# Patient Record
Sex: Male | Born: 1950 | Race: Black or African American | Hispanic: No | Smoking: Never smoker
Health system: Southern US, Community
[De-identification: ages and names within clinical notes are randomized; demographics above are authoritative.]

## PROBLEM LIST (undated history)

## (undated) DIAGNOSIS — I10 Essential (primary) hypertension: Secondary | ICD-10-CM

## (undated) DIAGNOSIS — D649 Anemia, unspecified: Secondary | ICD-10-CM

## (undated) HISTORY — PX: EXTERNAL EAR SURGERY: SHX627

## (undated) HISTORY — DX: Essential (primary) hypertension: I10

## (undated) HISTORY — PX: FOOT SURGERY: SHX648

## (undated) HISTORY — DX: Anemia, unspecified: D64.9

---

## 2005-05-25 ENCOUNTER — Emergency Department (HOSPITAL_COMMUNITY): Admission: EM | Admit: 2005-05-25 | Discharge: 2005-05-25 | Payer: Self-pay | Admitting: Emergency Medicine

## 2007-11-16 ENCOUNTER — Emergency Department (HOSPITAL_COMMUNITY): Admission: EM | Admit: 2007-11-16 | Discharge: 2007-11-16 | Payer: Self-pay | Admitting: Emergency Medicine

## 2010-10-07 LAB — URINE CULTURE: Colony Count: >=100000

## 2010-10-07 LAB — DIFFERENTIAL
Basophils Absolute: 0
Eosinophils Absolute: 0.1
Eosinophils Relative: 1
Lymphs Abs: 1.7
Monocytes Relative: 7
Neutrophils Relative %: 79 — ABNORMAL HIGH

## 2010-10-07 LAB — COMPREHENSIVE METABOLIC PANEL
ALT: 12
AST: 16
BUN: 15
CO2: 27
Creatinine, Ser: 1.12
Glucose, Bld: 110 — ABNORMAL HIGH
Total Protein: 8.1

## 2010-10-07 LAB — URINALYSIS, ROUTINE W REFLEX MICROSCOPIC
Glucose, UA: NEGATIVE
Nitrite: NEGATIVE
Specific Gravity, Urine: >1.03 — ABNORMAL HIGH

## 2010-10-07 LAB — CBC
MCHC: 33.6
MCV: 102.7 — ABNORMAL HIGH
RDW: 13.5

## 2010-10-07 LAB — URINE MICROSCOPIC-ADD ON

## 2013-09-20 ENCOUNTER — Encounter: Payer: Self-pay | Admitting: Internal Medicine

## 2013-10-20 ENCOUNTER — Ambulatory Visit: Payer: Self-pay | Admitting: Gastroenterology

## 2013-11-24 ENCOUNTER — Ambulatory Visit: Payer: Self-pay | Admitting: Gastroenterology

## 2013-11-24 ENCOUNTER — Encounter: Payer: Self-pay | Admitting: Gastroenterology

## 2013-11-24 ENCOUNTER — Telehealth: Payer: Self-pay | Admitting: Internal Medicine

## 2013-11-24 NOTE — Telephone Encounter (Signed)
PATIENT WAS A NO SHOW ON 11/24/13  LETTER SENT

## 2014-01-17 ENCOUNTER — Ambulatory Visit (INDEPENDENT_AMBULATORY_CARE_PROVIDER_SITE_OTHER): Payer: Self-pay | Admitting: Gastroenterology

## 2014-01-17 ENCOUNTER — Encounter: Payer: Self-pay | Admitting: Gastroenterology

## 2014-01-17 ENCOUNTER — Telehealth: Payer: Self-pay

## 2014-01-17 ENCOUNTER — Telehealth: Payer: Self-pay | Admitting: Gastroenterology

## 2014-01-17 ENCOUNTER — Other Ambulatory Visit: Payer: Self-pay

## 2014-01-17 VITALS — BP 142/90 | HR 97 | Temp 98.0°F | Ht 74.0 in | Wt 142.6 lb

## 2014-01-17 DIAGNOSIS — R195 Other fecal abnormalities: Secondary | ICD-10-CM

## 2014-01-17 DIAGNOSIS — D62 Acute posthemorrhagic anemia: Secondary | ICD-10-CM

## 2014-01-17 DIAGNOSIS — D649 Anemia, unspecified: Secondary | ICD-10-CM

## 2014-01-17 MED ORDER — PEG-KCL-NACL-NASULF-NA ASC-C 100 G PO SOLR: 1.0000 | Freq: Once | ORAL | Status: AC

## 2014-01-17 NOTE — Telephone Encounter (Signed)
We need to get a ferritin and BMP if possible. This was not done at the [facility] clinic. Thanks!

## 2014-01-17 NOTE — Assessment & Plan Note (Signed)
64 year old male with anemia of unknown etiology and heme positive stool but without overt GI bleeding. Hgb in 10 range with slightly elevated iron at 196. Unknown ferritin. No concerning lower or upper GI symptoms noted. Will need to check ferritin. Per patient, no known renal disease. Check BMP as well. Proceed with colonoscopy +/- EGD in the near future.   Proceed with TCS+/- EGD with Dr. Jena Gaussourk in near future: the risks, benefits, and alternatives have been discussed with the patient in detail. The patient states understanding and desires to proceed. Phenergan 12.5 mg IV on call due to ETOH use

## 2014-01-17 NOTE — Patient Instructions (Signed)
We have scheduled you for a colonoscopy and possible upper endoscopy with Dr. [name].  Further recommendations to follow!   

## 2014-01-17 NOTE — Telephone Encounter (Signed)
Called pt and he states he will be coming by tomorrow with papers. He is unsure if he will have a ride to procedure but he will let us know by Friday 01/19/2014.

## 2014-01-17 NOTE — Telephone Encounter (Signed)
Please explain to the patient that we would like to have his paperwork completed prior to his tcs.  If he receives any benefits such as food stamps, we would like to have a copy of his eligibility letter, please.   Routing to [place] for follow-up.

## 2014-01-17 NOTE — Assessment & Plan Note (Signed)
Without overt GI bleeding. Check iron now. Check BMP for renal status.

## 2014-01-17 NOTE — Progress Notes (Signed)
Primary Care Physician:  Alda Lea Primary Gastroenterologist:  Dr. Jena Gauss   Chief Complaint  Patient presents with  . Anemia    HPI:   Lawrence Watkins is a 64 y.o. male presenting today at [facility] request of [facility], [facility] [place], [place], secondary to anemia and heme positive stool. Outside labs reveal Hgb 10.9 in Dec 2015. Iron mildly elevated at 196.   No prior colonoscopy or upper endoscopy. No abdominal pain. No N/V. No reflux. No dysphagia. No constipation, diarrhea. No hematochezia. No melena. Took Ibuprofen on 2 different occasions recently due to pain in jaw. Doing "alot of walking". States he used to weigh about 145, now 142. Good appetite.   Past Medical History  Diagnosis Date  . Hypertension   . Anemia     Past Surgical History  Procedure Laterality Date  . External ear surgery    . Foot surgery      Current Outpatient Prescriptions  Medication Sig Dispense Refill  . aspirin EC 81 MG tablet Take 81 mg by mouth daily.     No current facility-administered medications for this visit.    Allergies as of 01/17/2014  . (No Known Allergies)    Family History  Problem Relation Age of Onset  . Colon cancer Neg Hx     History   Social History  . Marital Status: Divorced    Spouse Name: N/A    Number of Children: N/A  . Years of Education: N/A   Occupational History  . Not on file.   Social History Main Topics  . Smoking status: Never Smoker   . Smokeless tobacco: Not on file  . Alcohol Use: 0.0 oz/week    0 Not specified per week     Comment: 4-5 beers in a week  . Drug Use: No  . Sexual Activity: Not on file   Other Topics Concern  . Not on file   Social History Narrative  . No narrative on file    Review of Systems: Gen: Denies any fever, chills, fatigue, weight loss, lack of appetite.  CV: Denies chest pain, heart palpitations, peripheral edema, syncope.  Resp: Denies shortness of breath at rest or with exertion.  Denies wheezing or cough.  GI: see HPI GU : Denies urinary burning, urinary frequency, urinary hesitancy MS: +arthritis Derm: Denies rash, itching, dry skin Psych: Denies depression, anxiety, memory loss, and confusion Heme: Denies bruising, bleeding, and enlarged lymph nodes.  Physical Exam: BP 142/90 mmHg  Pulse 97  Temp(Src) 98 F (36.7 C) (Oral)  Ht  (1.88 m)  Wt 142 lb 9.6 oz (64.683 kg)  BMI 18.30 kg/m2 General:   Alert and oriented. Pleasant and cooperative. Well-nourished and well-developed.  Head:  Normocephalic and atraumatic. Eyes:  Without icterus, sclera clear and conjunctiva pink.  Ears:  Normal auditory acuity. Nose:  No deformity, discharge,  or lesions. Mouth:  No deformity or lesions, oral mucosa pink.  Lungs:  Clear to auscultation bilaterally. No wheezes, rales, or rhonchi. No distress.  Heart:  S1, S2 present without murmurs appreciated.  Abdomen:  +BS, soft, non-tender and non-distended. No HSM noted. No guarding or rebound. No masses appreciated.  Rectal:  Deferred  Msk:  Symmetrical without gross deformities. Normal posture. Extremities:  Without clubbing or edema. Neurologic:  Alert and  oriented x4;  grossly normal neurologically. Skin:  Intact without significant lesions or rashes. Psych:  Alert and cooperative. Normal mood and affect.  Outside labs Dec 2015:  Hgb 10.2, Hct 29.5, WBC 3.2, iron high at 196.

## 2014-01-17 NOTE — Telephone Encounter (Signed)
Pt does not have any insurance. Do we need to put his TCS out a little further to give him time to do the paper work for the [facility] [employer]. He is set up for 01/22/14.

## 2014-01-18 ENCOUNTER — Other Ambulatory Visit: Payer: Self-pay

## 2014-01-18 DIAGNOSIS — D649 Anemia, unspecified: Secondary | ICD-10-CM

## 2014-01-18 NOTE — Telephone Encounter (Signed)
I called the [facility] clinic, they need me to fax them the order and they will put it on their order form so the pt will be able to get the blood work done at no charge. The orders have been done and faxed to the free clinic.

## 2014-01-18 NOTE — Progress Notes (Signed)
I called Lawrence Watkins to review his financial application, no answer.

## 2014-01-18 NOTE — Telephone Encounter (Signed)
Pt is aware.  

## 2014-01-19 NOTE — OR Nursing (Signed)
Called patient for pre-procedure call and patient stated that he did not have a ride home and would like to reschedule his procedure to a day where he would have transportation. Dr. Jena Gaussourk notified. Procedure will be rescheduled for a later date.

## 2014-01-22 ENCOUNTER — Other Ambulatory Visit: Payer: Self-pay

## 2014-01-22 ENCOUNTER — Telehealth: Payer: Self-pay

## 2014-01-22 ENCOUNTER — Encounter (HOSPITAL_COMMUNITY): Admission: RE | Payer: Self-pay | Source: Ambulatory Visit

## 2014-01-22 ENCOUNTER — Ambulatory Visit (HOSPITAL_COMMUNITY): Admission: RE | Admit: 2014-01-22 | Payer: Self-pay | Source: Ambulatory Visit | Admitting: Internal Medicine

## 2014-01-22 DIAGNOSIS — D62 Acute posthemorrhagic anemia: Secondary | ICD-10-CM

## 2014-01-22 DIAGNOSIS — R195 Other fecal abnormalities: Secondary | ICD-10-CM

## 2014-01-22 SURGERY — COLONOSCOPY
Anesthesia: Moderate Sedation

## 2014-01-22 NOTE — Telephone Encounter (Signed)
New prep instructions mailed to patient. 

## 2014-01-22 NOTE — Telephone Encounter (Signed)
Per Shawna OrleansMelanie A. @ [facility].  01/19/2014 @ 1423.     When I called Lawrence MakiWJ MRN [id number], he said that he did not have a ride home for Monday and he wanted to cancel for Monday and reschedule. He is [name]'s 1:00 case. Can you please call him and get him rescheduled?  Thank you.    Called pt and he has been rescheduled for 02/07/2014 @ 915.   Pt aware and new instructions will be mailed.

## 2014-01-22 NOTE — Telephone Encounter (Signed)
Per email from Scalp LevelMelanie  at [facility].When I called Ann MakiWJ MRN [id number], he said that he did not have a ride home for Monday and he wanted to cancel for Monday and reschedule. He is [name]'s 1:00 case. Can you please call him and get him rescheduled?  Thank you.   Pt has been rescheduled for 02/07/2014

## 2014-01-23 NOTE — Progress Notes (Signed)
cc'ed to pcp °

## 2014-02-06 ENCOUNTER — Telehealth: Payer: Self-pay

## 2014-02-06 MED ORDER — SODIUM CHLORIDE 0.9 % IV SOLN
INTRAVENOUS | Status: DC
Start: 2014-02-06 — End: 2014-02-06

## 2014-02-06 MED ORDER — PROMETHAZINE HCL 25 MG/ML IJ SOLN: 12.5000 mg | Freq: Once | INTRAMUSCULAR | Status: DC

## 2014-02-06 NOTE — OR Nursing (Signed)
Called patient for his pre-procedure call. Patient stated, "I don't believe I want to come.I'm going to have to wait a little while longer." Ginger Hardie PulleyFarris notified at Dr. Luvenia Starchourk's office.

## 2014-02-06 NOTE — Telephone Encounter (Signed)
Per Melanie: Pt wants to hold off of having his TCS.

## 2014-02-07 ENCOUNTER — Ambulatory Visit (HOSPITAL_COMMUNITY): Admission: RE | Admit: 2014-02-07 | Payer: Self-pay | Source: Ambulatory Visit | Admitting: Internal Medicine

## 2014-02-07 ENCOUNTER — Encounter (HOSPITAL_COMMUNITY): Admission: RE | Payer: Self-pay | Source: Ambulatory Visit

## 2014-02-07 SURGERY — COLONOSCOPY
Anesthesia: Moderate Sedation

## 2014-02-07 NOTE — Telephone Encounter (Signed)
Noted  

## 2014-02-07 NOTE — Telephone Encounter (Signed)
Noted; Patient can call if desire changes

## 2014-02-26 NOTE — Telephone Encounter (Signed)
Patient came in concerning his bill from his office visit on 01/14/14.  He wanted me to call the [facility] office(Laura) and have them set his remaninig balance of $152.50.  He will need to pay $25.42 due the 28th of the month.  His next payment will be March 2016..Marland Kitchen

## 2014-02-26 NOTE — Telephone Encounter (Signed)
Patient agreed to payment plans and was not interested in applying for [place] assistance.

## 2015-09-26 ENCOUNTER — Other Ambulatory Visit (HOSPITAL_COMMUNITY): Payer: Self-pay | Admitting: Pharmacist

## 2016-04-18 ENCOUNTER — Encounter (HOSPITAL_COMMUNITY): Payer: Self-pay

## 2016-04-18 ENCOUNTER — Emergency Department (HOSPITAL_COMMUNITY): Payer: Self-pay

## 2016-04-18 ENCOUNTER — Emergency Department (HOSPITAL_COMMUNITY)
Admission: EM | Admit: 2016-04-18 | Discharge: 2016-04-18 | Disposition: A | Discharge status: Home / Self Care | Payer: Self-pay | Attending: Emergency Medicine | Admitting: Emergency Medicine

## 2016-04-18 DIAGNOSIS — I10 Essential (primary) hypertension: Secondary | ICD-10-CM | POA: Insufficient documentation

## 2016-04-18 DIAGNOSIS — Z79899 Other long term (current) drug therapy: Secondary | ICD-10-CM | POA: Insufficient documentation

## 2016-04-18 DIAGNOSIS — M19042 Primary osteoarthritis, left hand: Secondary | ICD-10-CM | POA: Insufficient documentation

## 2016-04-18 MED ORDER — IBUPROFEN 400 MG PO TABS
400.0000 mg | ORAL_TABLET | Freq: Once | ORAL | Status: AC
  Administered 2016-04-18: 400 mg via ORAL
  Filled 2016-04-18: qty 1

## 2016-04-18 NOTE — ED Notes (Signed)
To radiology

## 2016-04-18 NOTE — ED Notes (Signed)
Pt reports that he awakened with a sore wrist- He denies falling or injuring his wrist in any way He has a swollen L wrist with sensation intact

## 2016-04-18 NOTE — ED Provider Notes (Signed)
[facility]-EMERGENCY DEPT Provider Note   CSN: [id number] Arrival date & time: 04/18/16  1944     History   Chief Complaint Chief Complaint  Patient presents with  . Arm Swelling    HPI CHI Lawrence Watkins is a 66 y.o. male. He complains of atraumatic swelling and pain left wrist and hand.  No recent fever, chills, nausea, vomiting, weakness, neck pain or back pain.  There are no other no modifying factors.  No similar problem in the past.  No history of gout.  There are no other known modifying factors.  HPI  Past Medical History:  Diagnosis Date  . Anemia   . Hypertension     Patient Active Problem List   Diagnosis Date Noted  . Anemia 01/17/2014  . Heme positive stool 01/17/2014    Past Surgical History:  Procedure Laterality Date  . EXTERNAL EAR SURGERY    . FOOT SURGERY         Home Medications    Prior to Admission medications   Medication Sig Start Date End Date Taking? Authorizing Provider  acetaminophen (TYLENOL) 500 MG tablet Take 1,000 mg by mouth 2 (two) times daily as needed for mild pain or moderate pain.   Yes Historical Provider, MD    Family History Family History  Problem Relation Age of Onset  . Colon cancer Neg Hx     Social History Social History  Substance Use Topics  . Smoking status: Never Smoker  . Smokeless tobacco: Never Used  . Alcohol use 0.0 oz/week     Comment: 4-5 beers in a week     Allergies   Patient has no known allergies.   Review of Systems Review of Systems  All other systems reviewed and are negative.    Physical Exam Updated Vital Signs BP (!) 150/90 (BP Location: Right Arm)   Pulse (!) 105   Temp 99.4 F (37.4 C) (Tympanic)   Resp 18   Ht  (1.88 m)   Wt 145 lb (65.8 kg)   SpO2 97%   BMI 18.62 kg/m   Physical Exam  Constitutional: He is oriented to person, place, and time. He appears well-developed.  Elderly, frail  HENT:  Head: Normocephalic and atraumatic.  Right Ear: External ear  normal.  Left Ear: External ear normal.  Eyes: Conjunctivae and EOM are normal. Pupils are equal, round, and reactive to light.  Neck: Normal range of motion and phonation normal. Neck supple.  Cardiovascular: Normal rate.   Pulmonary/Chest: Effort normal. He exhibits no bony tenderness.  Musculoskeletal:  Decreased motion left wrist, and hand secondary to pain.  Moderate left wrist swelling diffusely with small palpable effusion.  Left hand, mildly tender dorsally at base, without associated erythema, or deformity in hand or fingers.  The left elbow tenderness or swelling.  No proximal streaking.  Normal range of motion left shoulder and left elbow.  Neurological: He is alert and oriented to person, place, and time. No cranial nerve deficit or sensory deficit. He exhibits normal muscle tone. Coordination normal.  Skin: Skin is warm, dry and intact.  Psychiatric: He has a normal mood and affect. His behavior is normal. Judgment and thought content normal.  Nursing note and vitals reviewed.    ED Treatments / Results  Labs (all labs ordered are listed, but only abnormal results are displayed) Labs Reviewed - No data to display  EKG  EKG Interpretation None       Radiology No results found.  Procedures  Procedures (including critical care time)  Medications Ordered in ED Medications - No data to display   Initial Impression / Assessment and Plan / ED Course  I have reviewed the triage vital signs and the nursing notes.  Pertinent labs & imaging results that were available during my care of the patient were reviewed by me and considered in my medical decision making (see chart for details).  Clinical Course as of Apr 18 2100  Sat Apr 18, 2016  2100 Carpometacarpal #1 DJD. DG Wrist Complete Left [EW]    Clinical Course User Index [EW] Mancel Bale, MD    Medications - No data to display  Patient Vitals for the past 24 hrs:  BP Temp Temp src Pulse Resp SpO2 Height  Weight  04/18/16 1951 (!) 150/90 99.4 F (37.4 C) Tympanic (!) 105 18 97 %  (1.88 m) 145 lb (65.8 kg)   21: 04-removable thumb spica splint ordered, to treat the pain and stabilize left first carpometacarpal joint.  9:32 PM Reevaluation with update and discussion. After initial assessment and treatment, an updated evaluation reveals there is comfortable, he tolerated the splint well.  Splint fit appropriately and improved his discomfort.  There is no nerve or vascular compromise, after application of the removable splint. Neveen Daponte L    Final Clinical Impressions(s) / ED Diagnoses   Final diagnoses:  None   Pain swelling left wrist and hand secondary to your arthritis, first carpometacarpal joint.  Doubt septic arthritis, or fracture.  No evidence for tendinitis.  Nursing Notes Reviewed/ Care Coordinated Applicable Imaging Reviewed Interpretation of Laboratory Data incorporated into ED treatment  The patient appears reasonably screened and/or stabilized for discharge and I doubt any other medical condition or other [facility] requiring further screening, evaluation, or treatment in the ED at this time prior to discharge.  Plan: Home Medications-ibuprofen 3 times daily as needed pain; Home Treatments-ice, advanced heat, splint as needed; return here if the recommended treatment, does not improve the symptoms; Recommended follow up-PCP of choice as needed   New Prescriptions New Prescriptions   No medications on file     Mancel Bale, MD 04/18/16 2132

## 2016-04-18 NOTE — ED Triage Notes (Signed)
Left hand swelling with pain radiating up left arm started this arm. Limited movement to affected extremity.

## 2016-04-18 NOTE — Discharge Instructions (Signed)
Use ice on sore area 3 times a day for 3 days after that, use heat.  For pain, use ibuprofen, 400 mg 3 times a day with meals.  Wear the splint that we supplied tonight, as needed for comfort.  He will also help to elevate your left hand above your heart, as much as possible to decrease the discomfort.  Use the phone number attached to this document, to help you find a doctor to see me for further evaluation and treatment, in 1 week.

## 2017-05-28 ENCOUNTER — Encounter (HOSPITAL_COMMUNITY): Payer: Self-pay | Admitting: Emergency Medicine

## 2017-05-28 ENCOUNTER — Emergency Department (HOSPITAL_COMMUNITY)
Admission: EM | Admit: 2017-05-28 | Discharge: 2017-05-28 | Disposition: A | Discharge status: Home / Self Care | Payer: Medicare Other | Attending: Emergency Medicine | Admitting: Emergency Medicine

## 2017-05-28 ENCOUNTER — Emergency Department (HOSPITAL_COMMUNITY): Payer: Medicare Other

## 2017-05-28 ENCOUNTER — Other Ambulatory Visit: Payer: Self-pay

## 2017-05-28 DIAGNOSIS — I10 Essential (primary) hypertension: Secondary | ICD-10-CM | POA: Diagnosis not present

## 2017-05-28 DIAGNOSIS — H6002 Abscess of left external ear: Secondary | ICD-10-CM | POA: Insufficient documentation

## 2017-05-28 DIAGNOSIS — R05 Cough: Secondary | ICD-10-CM | POA: Diagnosis not present

## 2017-05-28 DIAGNOSIS — R0981 Nasal congestion: Secondary | ICD-10-CM | POA: Diagnosis not present

## 2017-05-28 LAB — CBC WITH DIFFERENTIAL/PLATELET
Basophils Absolute: 0 10*3/uL (ref 0.0–0.1)
Basophils Relative: 0 %
Eosinophils Absolute: 0.1 10*3/uL (ref 0.0–0.7)
Eosinophils Relative: 1 %
HCT: 36.9 % — ABNORMAL LOW (ref 39.0–52.0)
Hemoglobin: 12.3 g/dL — ABNORMAL LOW (ref 13.0–17.0)
LYMPHS ABS: 1.7 10*3/uL (ref 0.7–4.0)
LYMPHS PCT: 32 %
MCH: 33.2 pg (ref 26.0–34.0)
MCHC: 33.3 g/dL (ref 30.0–36.0)
MCV: 99.5 fL (ref 78.0–100.0)
MONO ABS: 0.3 10*3/uL (ref 0.1–1.0)
MONOS PCT: 6 %
Neutro Abs: 3.2 10*3/uL (ref 1.7–7.7)
Neutrophils Relative %: 61 %
Platelets: 196 10*3/uL (ref 150–400)
RBC: 3.71 MIL/uL — ABNORMAL LOW (ref 4.22–5.81)
RDW: 12.8 % (ref 11.5–15.5)
WBC: 5.3 10*3/uL (ref 4.0–10.5)

## 2017-05-28 LAB — BASIC METABOLIC PANEL
ANION GAP: 9 (ref 5–15)
BUN: 13 mg/dL (ref 6–20)
CHLORIDE: 98 mmol/L — AB (ref 101–111)
CO2: 27 mmol/L (ref 22–32)
Calcium: 9.9 mg/dL (ref 8.9–10.3)
Creatinine, Ser: 1.02 mg/dL (ref 0.61–1.24)
GFR calc Af Amer: >60 mL/min (ref >60)
GFR calc non Af Amer: >60 mL/min (ref >60)
GLUCOSE: 118 mg/dL — AB (ref 65–99)
POTASSIUM: 4.3 mmol/L (ref 3.5–5.1)
Sodium: 134 mmol/L — ABNORMAL LOW (ref 135–145)

## 2017-05-28 MED ORDER — LISINOPRIL 10 MG PO TABS: 10.0000 mg | ORAL_TABLET | Freq: Every day | ORAL | 0 refills | Status: DC

## 2017-05-28 MED ORDER — SULFAMETHOXAZOLE-TRIMETHOPRIM 800-160 MG PO TABS: 1.0000 | ORAL_TABLET | Freq: Two times a day (BID) | ORAL | 0 refills | Status: AC

## 2017-05-28 NOTE — ED Provider Notes (Signed)
[facility] EMERGENCY DEPARTMENT Provider Note   CSN: [id number] Arrival date & time: 05/28/17  1339     History   Chief Complaint Chief Complaint  Patient presents with  . Cough    HPI Lawrence Watkins is a 67 y.o. male.  HPI  Lawrence Watkins is a 67 y.o. male who presents to the Emergency Department complaining of cough, nasal congestion and left ear pain for several hours.  Describes a cough that is occasional productive.  No shortness of breath or chest pain.  Denies peripheral edema.  Left ear pain is described as throbbing and constant.  He denies fever, chills, dizziness or headache.  Has not tried any OTC cough or cold medications   Past Medical History:  Diagnosis Date  . Anemia   . Hypertension     Patient Active Problem List   Diagnosis Date Noted  . Anemia 01/17/2014  . Heme positive stool 01/17/2014    Past Surgical History:  Procedure Laterality Date  . EXTERNAL EAR SURGERY    . FOOT SURGERY        Home Medications    Prior to Admission medications   Medication Sig Start Date End Date Taking? Authorizing Provider  acetaminophen (TYLENOL) 500 MG tablet Take 1,000 mg by mouth 2 (two) times daily as needed for mild pain or moderate pain.    [provider]    Family History Family History  Problem Relation Age of Onset  . Colon cancer Neg Hx     Social History Social History   Tobacco Use  . Smoking status: Never Smoker  . Smokeless tobacco: Never Used  Substance Use Topics  . Alcohol use: Yes    Alcohol/week: 0.0 oz    Comment: occasionally  . Drug use: No     Allergies   Patient has no known allergies.   Review of Systems Review of Systems  Constitutional: Negative for appetite change, chills and fever.  HENT: Positive for congestion and ear pain. Negative for sore throat and trouble swallowing.   Respiratory: Positive for cough. Negative for chest tightness, shortness of breath and wheezing.   Cardiovascular:  Negative for chest pain.  Gastrointestinal: Negative for abdominal pain, nausea and vomiting.  Genitourinary: Negative for dysuria and flank pain.  Musculoskeletal: Negative for arthralgias, myalgias, neck pain and neck stiffness.  Skin: Negative for rash.  Neurological: Negative for dizziness, weakness, light-headedness, numbness and headaches.  Hematological: Negative for adenopathy.  All other systems reviewed and are negative.    Physical Exam Updated Vital Signs BP (!) 170/121 (BP Location: Right Arm)   Pulse 82   Temp 98.2 F (36.8 C) (Oral)   Resp 16   Ht  (1.88 m)   Wt 68 kg (150 lb)   SpO2 98%   BMI 19.26 kg/m   Physical Exam  Constitutional: He is oriented to person, place, and time. He appears well-developed and well-nourished. No distress.  HENT:  Head: Normocephalic and atraumatic.  Right Ear: Tympanic membrane and ear canal normal. Tympanic membrane is not erythematous.  Left Ear: Tympanic membrane and ear canal normal. There is tenderness. No drainage. Tympanic membrane is not erythematous.  Ears:  Mouth/Throat: Uvula is midline, oropharynx is clear and moist and mucous membranes are normal. No oropharyngeal exudate.  Focal tenderness of the external left ear. No erythema, fluctuance or drainage.    Eyes: Pupils are equal, round, and reactive to light. EOM are normal.  Neck: Normal range of motion, full  passive range of motion without pain and phonation normal. Neck supple.  Cardiovascular: Normal rate, regular rhythm, normal heart sounds and intact distal pulses.  No murmur heard. Pulmonary/Chest: Effort normal and breath sounds normal. No stridor. No respiratory distress. He has no rales. He exhibits no tenderness.  Abdominal: Soft. He exhibits no distension. There is no tenderness.  Musculoskeletal: He exhibits no edema or tenderness.  Lymphadenopathy:    He has no cervical adenopathy.  Neurological: He is alert and oriented to person, place, and  time. No sensory deficit. He exhibits normal muscle tone. Coordination normal.  Skin: Skin is warm and dry. Capillary refill takes less than 2 seconds. No rash noted.  Psychiatric: He has a normal mood and affect.  Nursing note and vitals reviewed.    ED Treatments / Results  Labs (all labs ordered are listed, but only abnormal results are displayed) Labs Reviewed  BASIC METABOLIC PANEL - Abnormal; Notable for the following components:      Result Value   Sodium 134 (*)    Chloride 98 (*)    Glucose, Bld 118 (*)    All other components within normal limits  CBC WITH DIFFERENTIAL/PLATELET - Abnormal; Notable for the following components:   RBC 3.71 (*)    Hemoglobin 12.3 (*)    HCT 36.9 (*)    All other components within normal limits    EKG None  Radiology Dg Chest 2 View  Result Date: 05/28/2017 CLINICAL DATA:  Cough EXAM: CHEST - 2 VIEW COMPARISON:  None. FINDINGS: The heart is moderately enlarged. Normal vascularity. Lungs are hyperaerated. Costophrenic angles are not included limiting the examination. Visualized lungs are clear. No pneumothorax. L1 compression deformity of indeterminate age IMPRESSION: Limited study. No active cardiopulmonary disease. L1 compression fracture of indeterminate age. Electronically Signed   By: Jolaine Click M.D.   On: 05/28/2017 16:29     Procedures Procedures (including critical care time)  Medications Ordered in ED Medications - No data to display   Initial Impression / Assessment and Plan / ED Course  I have reviewed the triage vital signs and the nursing notes.  Pertinent labs & imaging results that were available during my care of the patient were reviewed by me and considered in my medical decision making (see chart for details).     Pt is well appearing.  Hypertensive with hx of same.  Only sx's today are ear pain and cough, congestion.  CXR neg for PNA or CHF, and labs reassuring. probable developing abscess of external ear.   Hx regarding HTN is somewhat confusing.  He states that his PCP stopped his anti-hypertensive medication due to anemia.  Confirmed through pharmacy that pt was taking lisinopril (not filled since 2016)  Will refill his medication and pt provided clinic referral info and advised to establish primary care.  Pt verbalized understanding and agrees to plan.    Final Clinical Impressions(s) / ED Diagnoses   Final diagnoses:  Abscess of left external ear  Uncontrolled hypertension    ED Discharge Orders    None       Pauline Aus, PA-C 06/01/17 1744    Vanetta Mulders, MD 06/01/17 [phone]

## 2017-05-28 NOTE — ED Triage Notes (Signed)
Patient complaining of cough and left ear pain since this morning.

## 2017-05-28 NOTE — ED Notes (Signed)
Patient's B/P 170/121 in triage. States he was told at the [facility] clinic to discontinue his blood pressure medication "a while back."

## 2017-05-28 NOTE — Discharge Instructions (Addendum)
Try to avoid squeezing your ear.  Apply warm wet compresses on and off and take the antibiotic as directed until finished.  Your blood pressure today is too high.  It is important that you take the blood pressure medication as directed every day.  I provided referral information for some of the local clinics, try to call 1 of the clinics listed to establish primary care.

## 2017-11-09 ENCOUNTER — Ambulatory Visit (INDEPENDENT_AMBULATORY_CARE_PROVIDER_SITE_OTHER): Payer: Self-pay

## 2017-11-09 DIAGNOSIS — Z1211 Encounter for screening for malignant neoplasm of colon: Secondary | ICD-10-CM

## 2017-11-09 MED ORDER — PEG 3350-KCL-NA BICARB-NACL 420 G PO SOLR: 4000.0000 mL | ORAL | 0 refills | Status: AC

## 2017-11-09 NOTE — Progress Notes (Signed)
Gastroenterology Pre-Procedure Review  Request Date:11/09/17 Requesting Physician: Marylynn Pearson [facility] no previous tcs  PATIENT REVIEW QUESTIONS: The patient responded to the following health history questions as indicated:    1. Diabetes Melitis: no 2. Joint replacements in the past 12 months: no 3. Major health problems in the past 3 months: no 4. Has an artificial valve or MVP: no 5. Has a defibrillator: no 6. Has been advised in past to take antibiotics in advance of a procedure like teeth cleaning: no 7. Family history of colon cancer: no  8. Alcohol Use: yes (occasionally ) 9. History of sleep apnea: no  10. History of coronary artery or other vascular stents placed within the last 12 months: no 11. History of any prior anesthesia complications: no    MEDICATIONS & ALLERGIES:    Patient reports the following regarding taking any blood thinners:   Plavix? no Aspirin? no Coumadin? no Brilinta? no Xarelto? no Eliquis? no Pradaxa? no Savaysa? no Effient? no  Patient confirms/reports the following medications:  Current Outpatient Medications  Medication Sig Dispense Refill  . lisinopril (PRINIVIL,ZESTRIL) 10 MG tablet Take 1 tablet (10 mg total) by mouth daily. 30 tablet 0  . naproxen sodium (ALEVE) 220 MG tablet Take 220 mg by mouth daily as needed.     No current facility-administered medications for this visit.     Patient confirms/reports the following allergies:  No Known Allergies  No orders of the defined types were placed in this encounter.   AUTHORIZATION INFORMATION Primary Insurance: medicare,  ID #: [id number] Pre-Cert / Auth required: no   SCHEDULE INFORMATION: Procedure has been scheduled as follows:  Date: 01/19/18, Time: 12:00 Location: [facility] Dr.[name]  This Gastroenterology Pre-Precedure Review Form is being routed to the following provider(s): Tana Coast, PA

## 2017-11-09 NOTE — Patient Instructions (Signed)
Lawrence Watkins   27-Jul-1950 MRN: [id number]    Procedure Date: 01/19/18 Time to register: 11:00am Place to register: [facility] Procedure Time: 12:00pm Scheduled provider: R. Garfield Cornea, MD  PREPARATION FOR COLONOSCOPY WITH TRI-LYTE SPLIT PREP  Please notify us immediately if you are diabetic, take iron supplements, or if you are on Coumadin or any other blood thinners.   You will need to purchase 1 fleet enema and 1 box of Bisacodyl 60m tablets.   2 DAYS BEFORE PROCEDURE:  DATE: 01/17/18   DAY: Monday Begin clear liquid diet AFTER your lunch meal. NO SOLID FOODS after this point.  1 DAY BEFORE PROCEDURE:  DATE: 01/18/18  DAY: Tuesday Continue clear liquids the entire day - NO SOLID FOOD.   At 2:00 pm:  Take 2 Bisacodyl tablets.   At 4:00pm:  Start drinking your solution. Make sure you mix well per instructions on the bottle. Try to drink 1 (one) 8 ounce glass every 10-15 minutes until you have consumed HALF the jug. You should complete by 6:00pm.You must keep the left over solution refrigerated until completed next day.  Continue clear liquids. You must drink plenty of clear liquids to prevent dehyration and kidney failure.     DAY OF PROCEDURE:   DATE: 01/19/18 DAY: Wednesday If you take medications for your heart, blood pressure or breathing, you may take these medications.   Five hours before your procedure time @ 7:00am:  Finish remaining amout of bowel prep, drinking 1 (one) 8 ounce glass every 10-15 minutes until complete. You have two hours to consume remaining prep.   Three hours before your procedure time @9 :00am:  Nothing by mouth.   At least one hour before going to the hospital:  Give yourself one Fleet enema. You may take your morning medications with sip of water unless we have instructed otherwise.      Please see below for Dietary Information.  CLEAR LIQUIDS INCLUDE:  Water Jello (NOT red in color)   Ice Popsicles (NOT red in color)   Tea  (sugar ok, no milk/cream) Powdered fruit flavored drinks  Coffee (sugar ok, no milk/cream) Gatorade/ Lemonade/ Kool-Aid  (NOT red in color)   Juice: apple, white grape, white cranberry Soft drinks  Clear bullion, consomme, broth (fat free beef/chicken/vegetable)  Carbonated beverages (any kind)  Strained chicken noodle soup Hard Candy   Remember: Clear liquids are liquids that will allow you to see your fingers on the other side of a clear glass. Be sure liquids are NOT red in color, and not cloudy, but CLEAR.  DO NOT EAT OR DRINK ANY OF THE FOLLOWING:  Dairy products of any kind   Cranberry juice Tomato juice / V8 juice   Grapefruit juice Orange juice     Red grape juice  Do not eat any solid foods, including such foods as: cereal, oatmeal, yogurt, fruits, vegetables, creamed soups, eggs, bread, crackers, pureed foods in a blender, etc.   HELPFUL HINTS FOR DRINKING PREP SOLUTION:   Make sure prep is extremely cold. Mix and refrigerate the the morning of the prep. You may also put in the freezer.   You may try mixing some Crystal Light or Country Time Lemonade if you prefer. Mix in small amounts; add more if necessary.  Try drinking through a straw  Rinse mouth with water or a mouthwash between glasses, to remove after-taste.  Try sipping on a cold beverage /ice/ popsicles between glasses of prep.  Place a piece of  sugar-free hard candy in mouth between glasses.  If you become nauseated, try consuming smaller amounts, or stretch out the time between glasses. Stop for 30-60 minutes, then slowly start back drinking.        OTHER INSTRUCTIONS  You will need a responsible adult at least 66 years of age to accompany you and drive you home. This person must remain in the waiting room during your procedure. The hospital will cancel your procedure if you do not have a responsible adult with you.   1. Wear loose fitting clothing that is easily removed. 2. Leave jewelry and other  valuables at home.  3. Remove all body piercing jewelry and leave at home. 4. Total time from sign-in until discharge is approximately 2-3 hours. 5. You should go home directly after your procedure and rest. You can resume normal activities the day after your procedure. 6. The day of your procedure you should not:  Drive  Make legal decisions  Operate machinery  Drink alcohol  Return to work   You may call the office (Dept: [phone]) before 5:00pm, or page the doctor on call [phone]) after 5:00pm, for further instructions, if necessary.   Insurance Information YOU WILL NEED TO CHECK WITH YOUR INSURANCE COMPANY FOR THE BENEFITS OF COVERAGE YOU HAVE FOR THIS PROCEDURE.  UNFORTUNATELY, NOT ALL INSURANCE COMPANIES HAVE BENEFITS TO COVER ALL OR PART OF THESE TYPES OF PROCEDURES.  IT IS YOUR RESPONSIBILITY TO CHECK YOUR BENEFITS, HOWEVER, WE WILL BE GLAD TO ASSIST YOU WITH ANY CODES YOUR INSURANCE COMPANY MAY NEED.    PLEASE NOTE THAT MOST INSURANCE COMPANIES WILL NOT COVER A SCREENING COLONOSCOPY FOR PEOPLE UNDER THE AGE OF 50  IF YOU HAVE BCBS INSURANCE, YOU MAY HAVE BENEFITS FOR A SCREENING COLONOSCOPY BUT IF POLYPS ARE FOUND THE DIAGNOSIS WILL CHANGE AND THEN YOU MAY HAVE A DEDUCTIBLE THAT WILL NEED TO BE MET. SO PLEASE MAKE SURE YOU CHECK YOUR BENEFITS FOR A SCREENING COLONOSCOPY AS WELL AS A DIAGNOSTIC COLONOSCOPY.

## 2017-11-10 NOTE — Progress Notes (Signed)
Ok to schedule.

## 2018-01-18 ENCOUNTER — Telehealth: Payer: Self-pay

## 2018-01-18 NOTE — Telephone Encounter (Signed)
noted 

## 2018-01-18 NOTE — OR Nursing (Signed)
Called patient for pre-op call and patient stated that he has a cold and needs to cancel his procedure for tomorrow. Instructed patient to call the office when he is ready to reschedule. Martina notified.

## 2018-01-18 NOTE — Telephone Encounter (Signed)
Melanie at [facility] endo called office. Pt cancelled his TCS for tomorrow d/t he has a cold.  Routing to [employer].

## 2018-01-19 ENCOUNTER — Encounter (HOSPITAL_COMMUNITY): Admission: RE | Payer: Self-pay | Source: Ambulatory Visit

## 2018-01-19 ENCOUNTER — Ambulatory Visit (HOSPITAL_COMMUNITY): Admission: RE | Admit: 2018-01-19 | Payer: Medicare Other | Source: Ambulatory Visit | Admitting: Internal Medicine

## 2018-01-19 SURGERY — COLONOSCOPY
Anesthesia: Moderate Sedation

## 2018-12-11 IMAGING — DX DG WRIST COMPLETE 3+V*L*
4 series · 4 of 4 positions shown · non-contrast
Comparison: None.

CLINICAL DATA: Left wrist pain and swelling.  No reported injury.

EXAM:
LEFT WRIST - COMPLETE 3+ VIEW

[wrist pa]
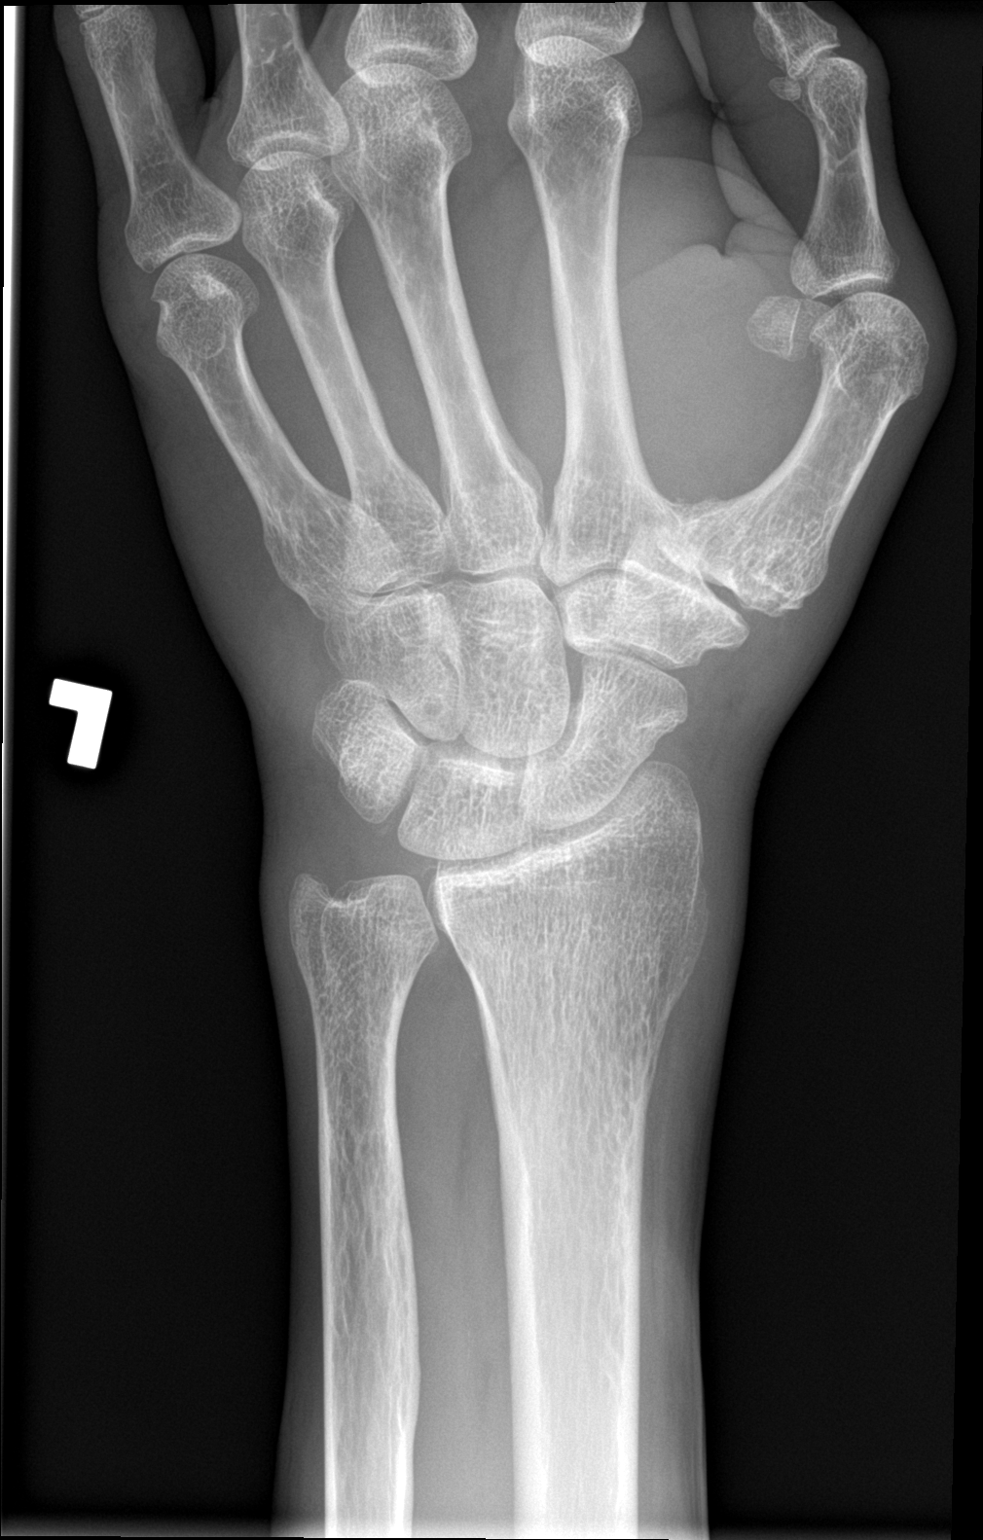

[wrist obl]
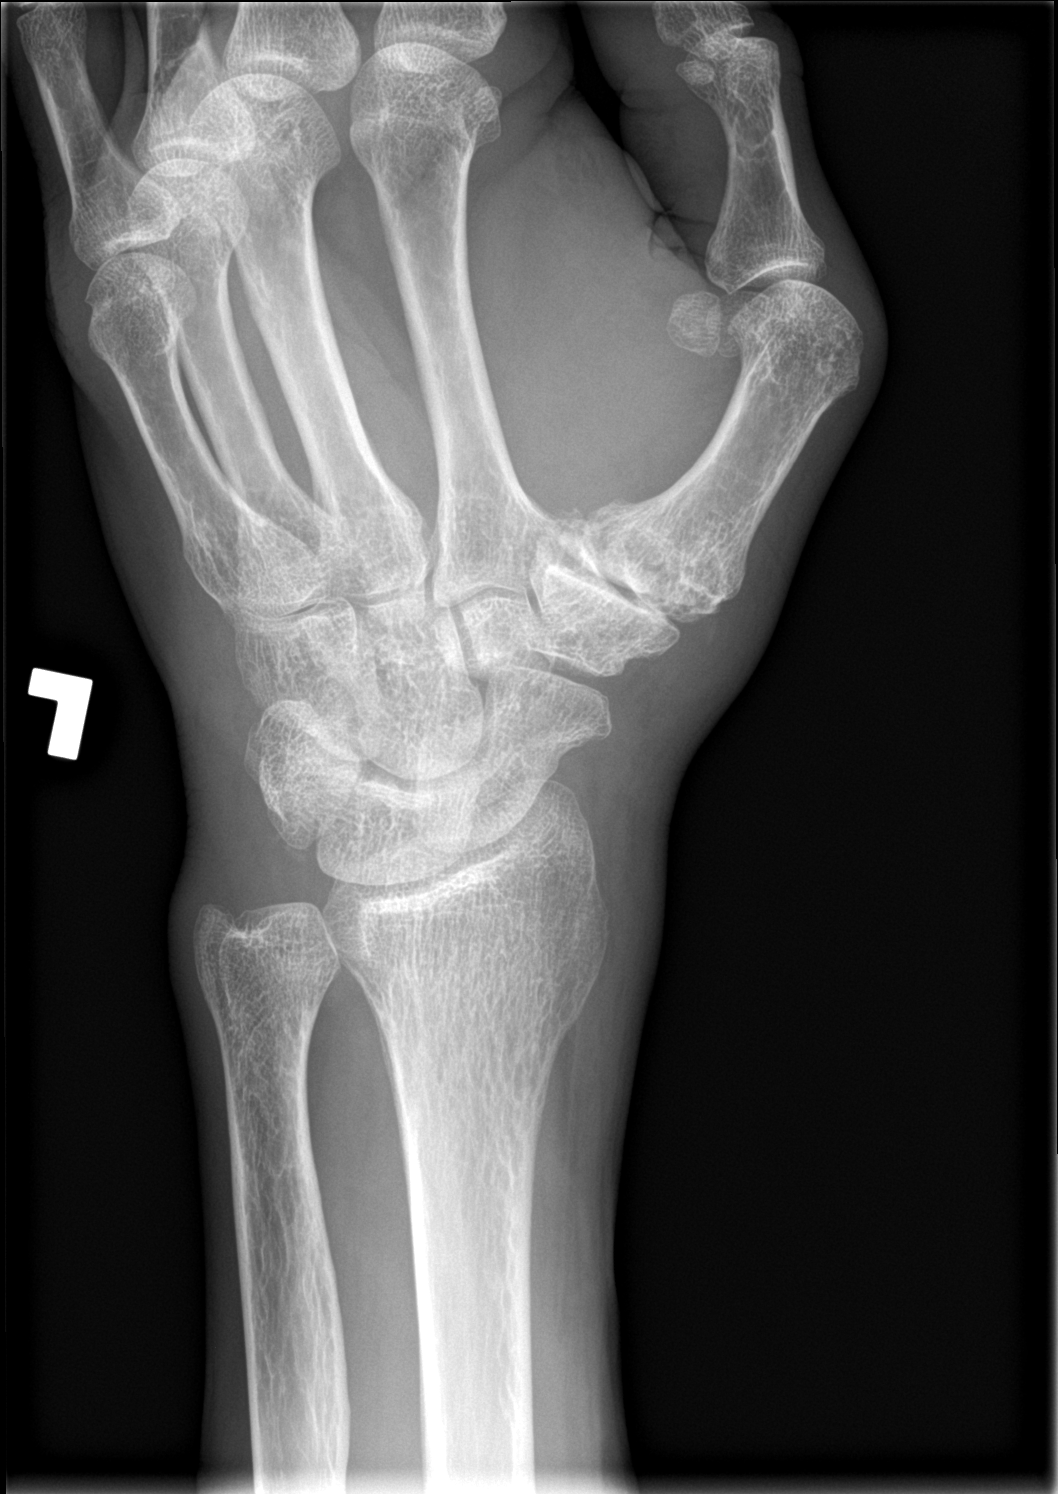

[wrist lat]
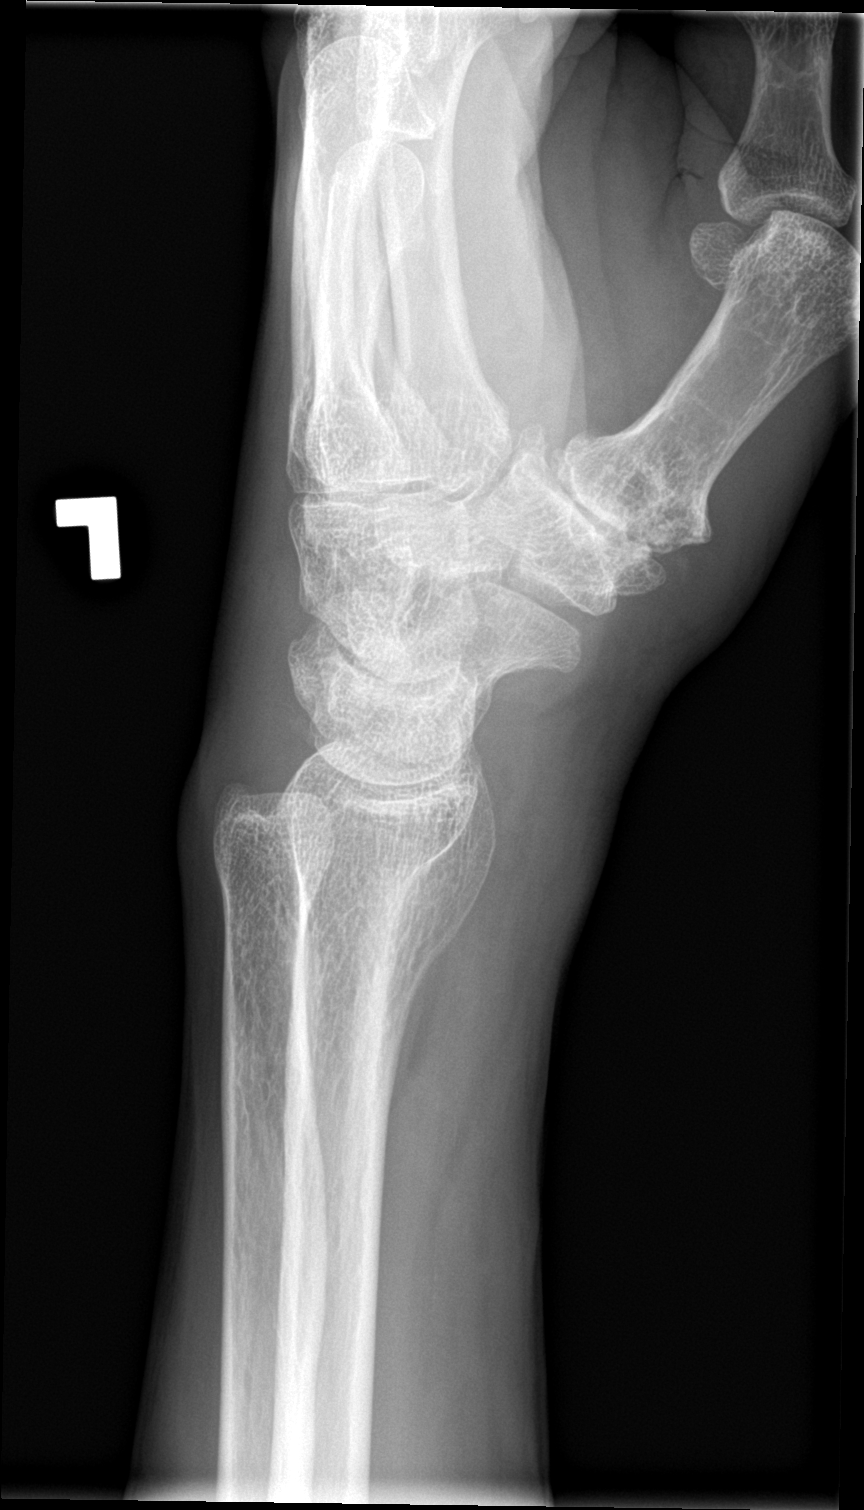

[wrist navicular]
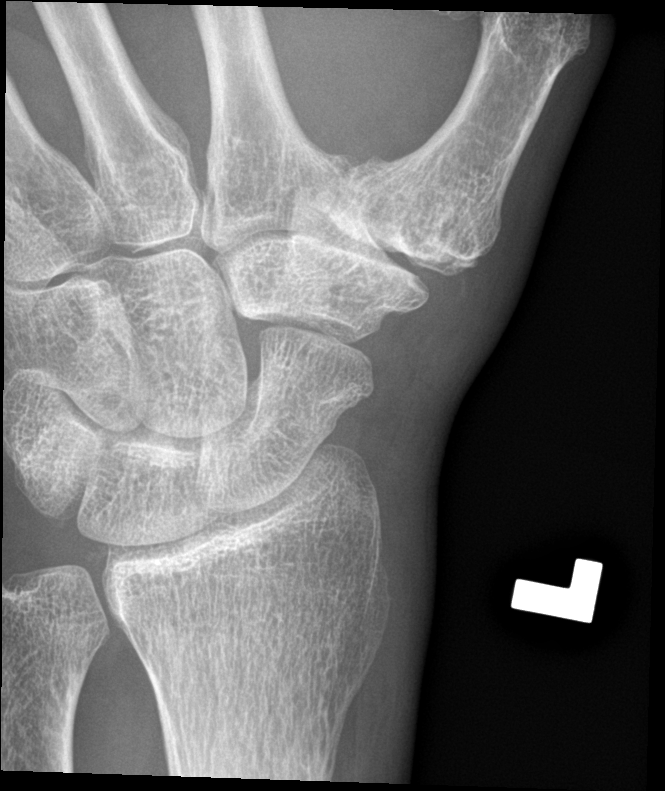

[4 of 4 positions shown; findings below may reference images not displayed]

FINDINGS: No fracture or dislocation. Moderate osteoarthritis at the first
carpometacarpal joint. No suspicious focal osseous lesion. No
radiopaque foreign body.
IMPRESSION: No fracture or malalignment. Moderate first carpometacarpal joint
osteoarthritis.

## 2020-01-20 IMAGING — DX DG CHEST 2V
2 series · 3 of 3 positions shown · non-contrast
Comparison: None.

CLINICAL DATA: Cough

EXAM:
CHEST - 2 VIEW

[Series 1: chest pa · 0.14mm/px · 2 of 2 slices shown]
[im 1/2]
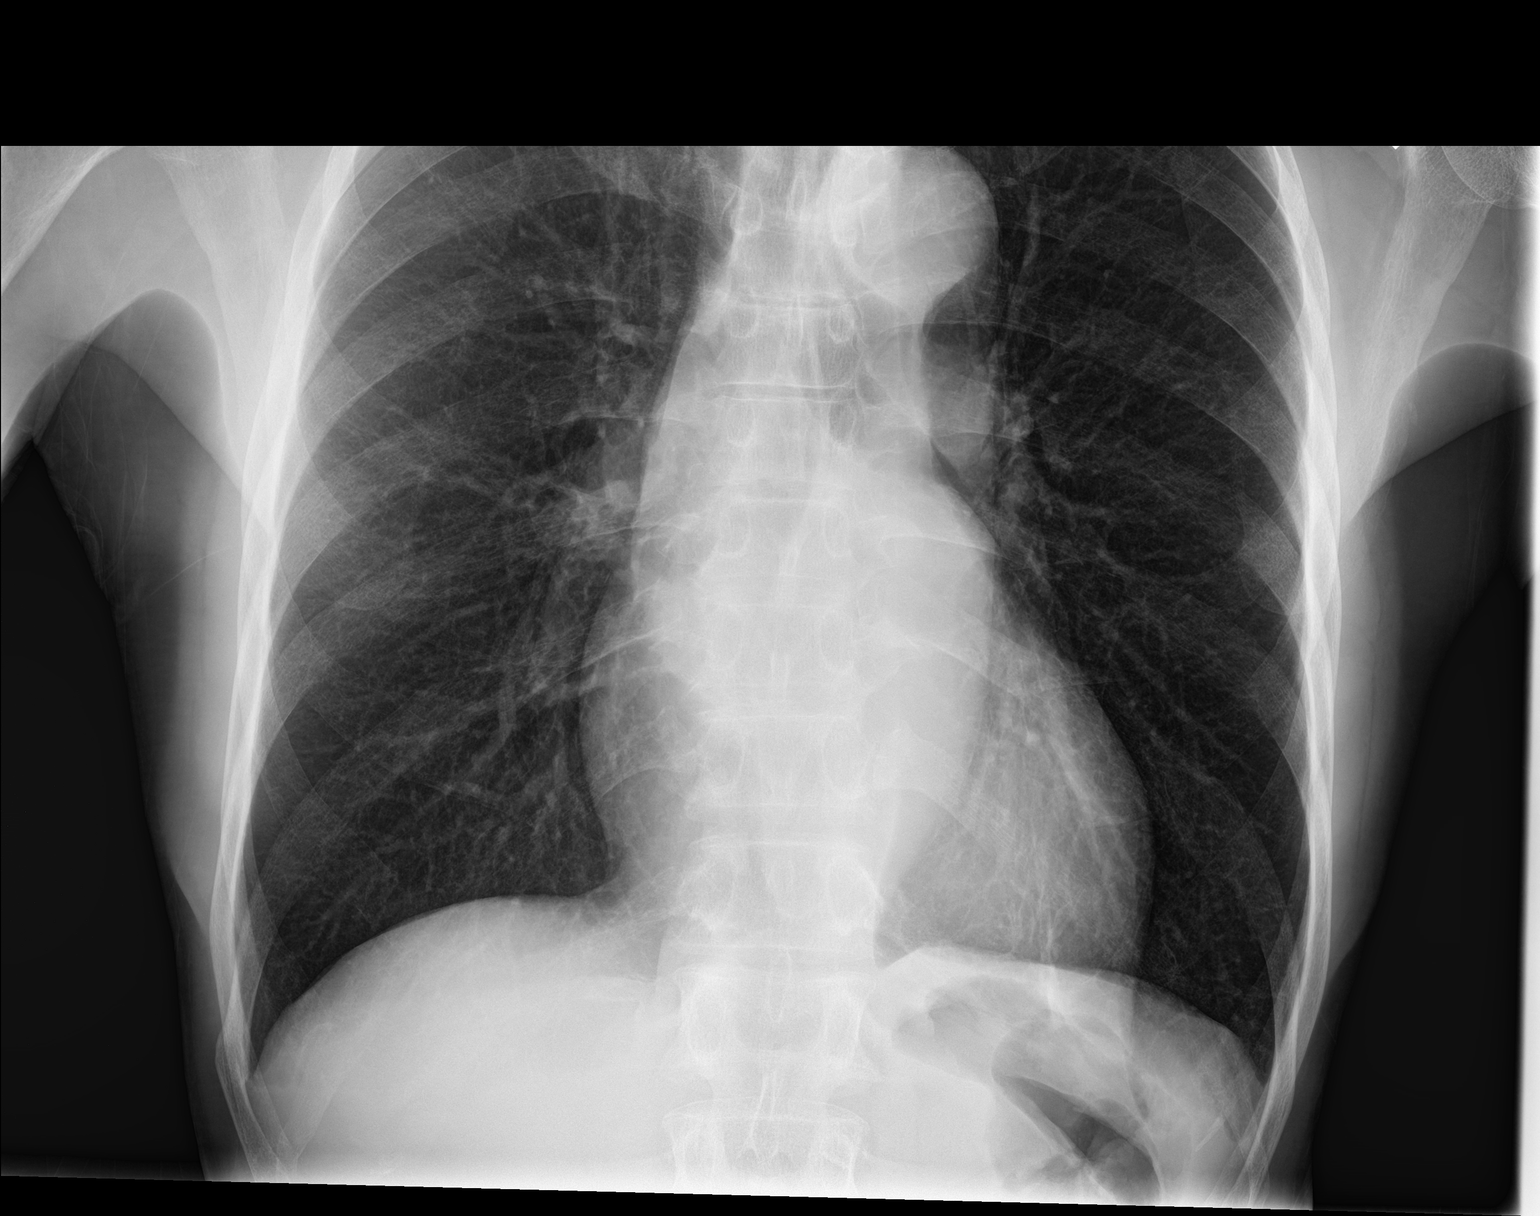
[im 2/2]
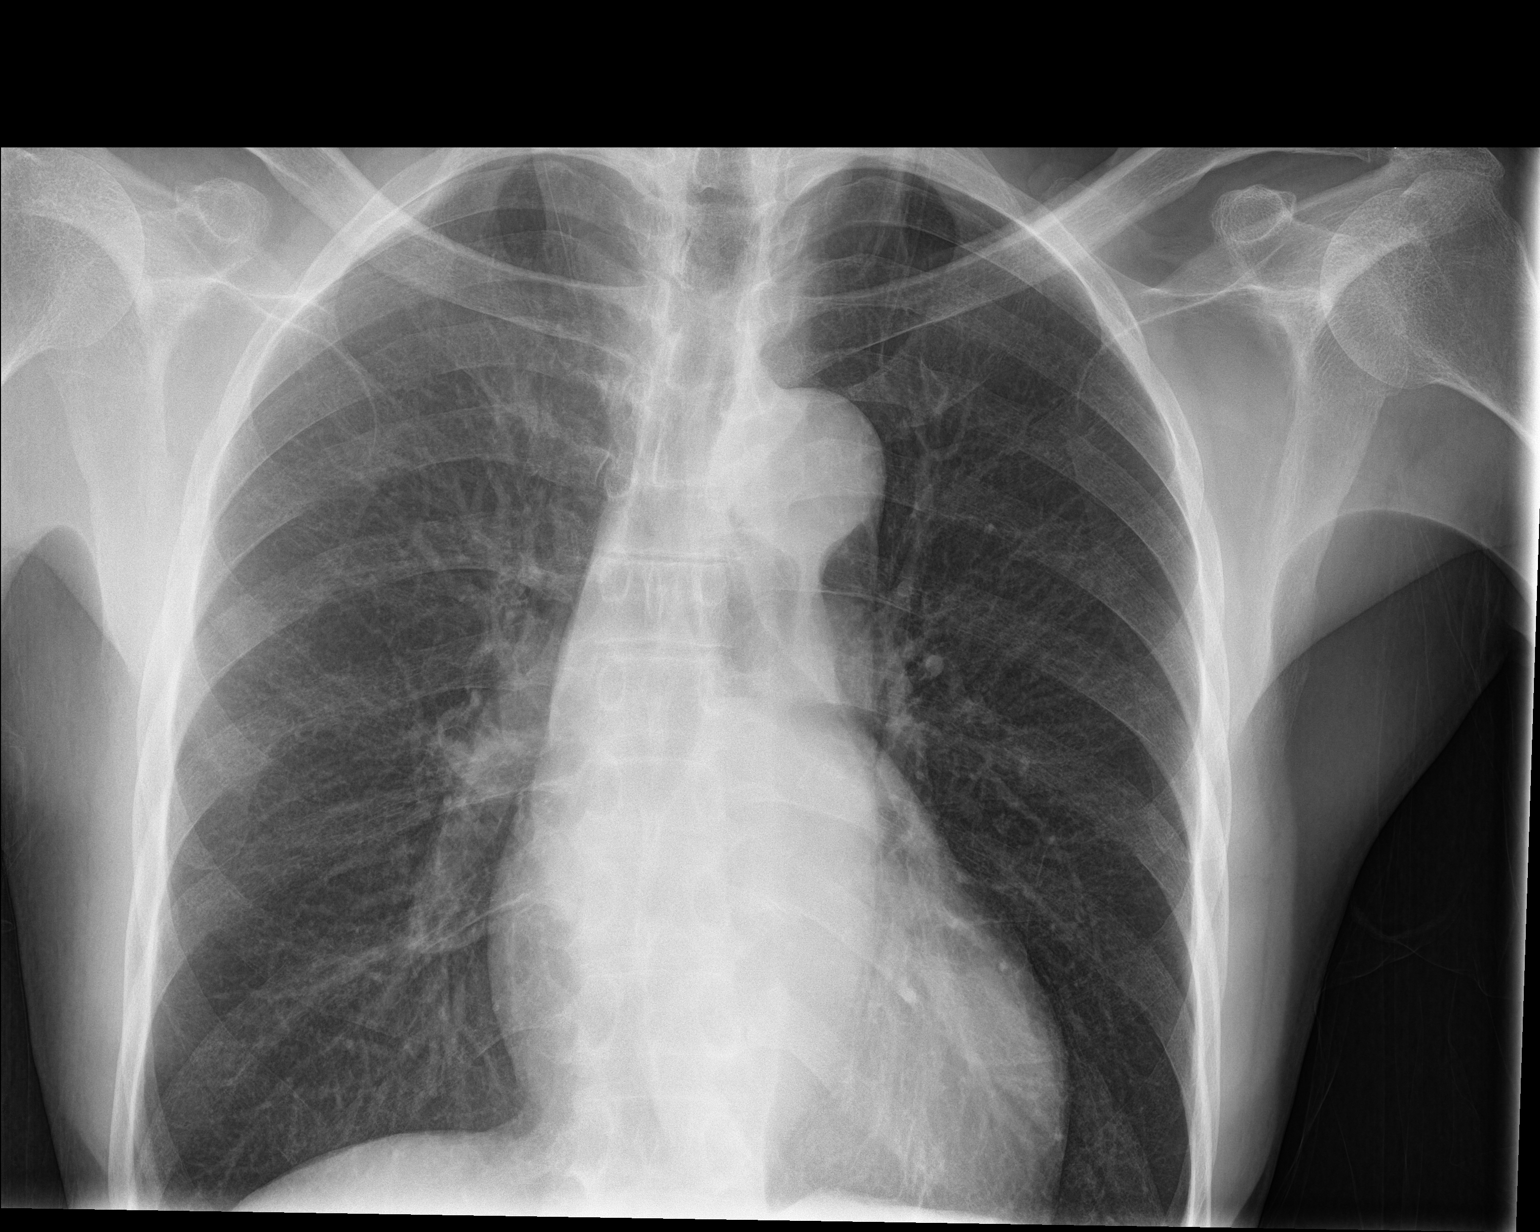

[chest lat]
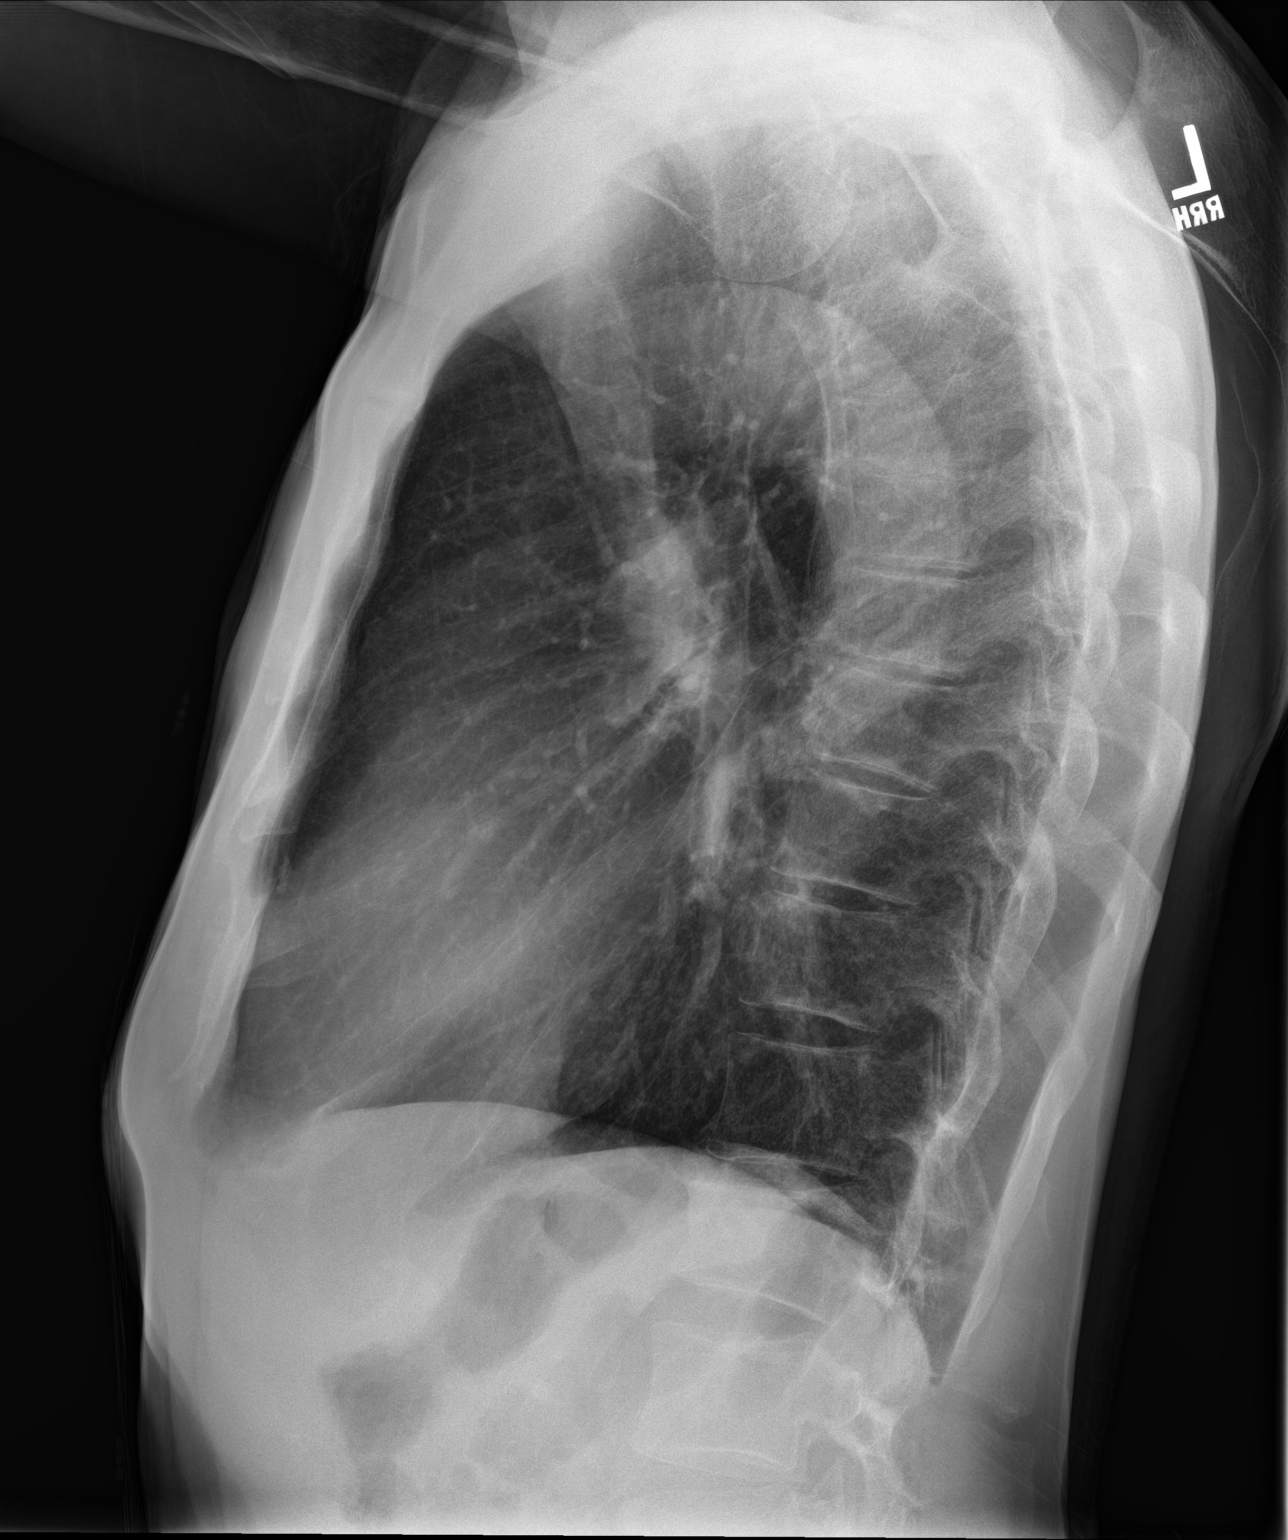

[3 of 3 positions shown; findings below may reference images not displayed]

FINDINGS: The heart is moderately enlarged. Normal vascularity. Lungs are
hyperaerated. Costophrenic angles are not included limiting the
examination. Visualized lungs are clear. No pneumothorax. L1
compression deformity of indeterminate age
IMPRESSION: Limited study.

No active cardiopulmonary disease.

L1 compression fracture of indeterminate age.

## 2020-06-06 ENCOUNTER — Other Ambulatory Visit (HOSPITAL_COMMUNITY): Payer: Self-pay | Admitting: Family Medicine

## 2020-06-06 DIAGNOSIS — Z13820 Encounter for screening for osteoporosis: Secondary | ICD-10-CM

## 2022-03-02 ENCOUNTER — Emergency Department (HOSPITAL_COMMUNITY)
Admission: EM | Admit: 2022-03-02 | Discharge: 2022-03-02 | Disposition: A | Discharge status: Home / Self Care | Payer: Medicare Other | Attending: Emergency Medicine | Admitting: Emergency Medicine

## 2022-03-02 ENCOUNTER — Other Ambulatory Visit: Payer: Self-pay

## 2022-03-02 ENCOUNTER — Encounter (HOSPITAL_COMMUNITY): Payer: Self-pay | Admitting: *Deleted

## 2022-03-02 DIAGNOSIS — M7989 Other specified soft tissue disorders: Secondary | ICD-10-CM | POA: Diagnosis present

## 2022-03-02 DIAGNOSIS — Z79899 Other long term (current) drug therapy: Secondary | ICD-10-CM | POA: Insufficient documentation

## 2022-03-02 DIAGNOSIS — R600 Localized edema: Secondary | ICD-10-CM | POA: Insufficient documentation

## 2022-03-02 DIAGNOSIS — D649 Anemia, unspecified: Secondary | ICD-10-CM | POA: Insufficient documentation

## 2022-03-02 DIAGNOSIS — I83893 Varicose veins of bilateral lower extremities with other complications: Secondary | ICD-10-CM | POA: Insufficient documentation

## 2022-03-02 DIAGNOSIS — I10 Essential (primary) hypertension: Secondary | ICD-10-CM | POA: Insufficient documentation

## 2022-03-02 LAB — COMPREHENSIVE METABOLIC PANEL
ALT: 12 U/L (ref 0–44)
AST: 20 U/L (ref 15–41)
Albumin: 2.9 g/dL — ABNORMAL LOW (ref 3.5–5.0)
Alkaline Phosphatase: 83 U/L (ref 38–126)
Anion gap: 8 (ref 5–15)
BUN: 12 mg/dL (ref 8–23)
CO2: 25 mmol/L (ref 22–32)
Calcium: 8.5 mg/dL — ABNORMAL LOW (ref 8.9–10.3)
Chloride: 106 mmol/L (ref 98–111)
Creatinine, Ser: 1.16 mg/dL (ref 0.61–1.24)
GFR, Estimated: >60 mL/min (ref >60)
Glucose, Bld: 107 mg/dL — ABNORMAL HIGH (ref 70–99)
Potassium: 3.6 mmol/L (ref 3.5–5.1)
Sodium: 139 mmol/L (ref 135–145)
Total Bilirubin: 0.4 mg/dL (ref 0.3–1.2)
Total Protein: 7 g/dL (ref 6.5–8.1)

## 2022-03-02 LAB — CBC
HCT: 28.5 % — ABNORMAL LOW (ref 39.0–52.0)
Hemoglobin: 9.1 g/dL — ABNORMAL LOW (ref 13.0–17.0)
MCH: 33 pg (ref 26.0–34.0)
MCHC: 31.9 g/dL (ref 30.0–36.0)
MCV: 103.3 fL — ABNORMAL HIGH (ref 80.0–100.0)
Platelets: 255 10*3/uL (ref 150–400)
RBC: 2.76 MIL/uL — ABNORMAL LOW (ref 4.22–5.81)
RDW: 14.7 % (ref 11.5–15.5)
WBC: 5 10*3/uL (ref 4.0–10.5)
nRBC: 0 % (ref 0.0–0.2)

## 2022-03-02 LAB — POC OCCULT BLOOD, ED: Fecal Occult Bld: NEGATIVE

## 2022-03-02 LAB — BRAIN NATRIURETIC PEPTIDE: B Natriuretic Peptide: 287 pg/mL — ABNORMAL HIGH (ref 0.0–100.0)

## 2022-03-02 MED ORDER — FUROSEMIDE 20 MG PO TABS: 20.0000 mg | ORAL_TABLET | Freq: Every day | ORAL | 0 refills | Status: AC

## 2022-03-02 NOTE — ED Triage Notes (Signed)
Pt is here for pain in ankles and calves since last week.  Not associated with any trauma, no sob.

## 2022-03-02 NOTE — Discharge Instructions (Addendum)
Please take your lasix as prescribed. Take tylenol/ibuprofen for pain. I recommend close follow-up with PCP for reevaluation.  Please do not hesitate to return to emergency department if worrisome signs symptoms we discussed become apparent.

## 2022-03-02 NOTE — ED Provider Notes (Signed)
[facility] Provider Note   CSN: [id number] Arrival date & time: 03/02/22  1647     History  Chief Complaint  Patient presents with   Ankle Pain    Lawrence Watkins is a 72 y.o. male with a past medical history of hypertension, anemia presents today for evaluation of leg pain and swelling.  Patient reports bilateral leg pain and swelling in the last 7 days.  Patient states pain is mostly behind the calves and ankles.  He reports being able to ambulate at home.  Denies any recent injury to his leg or recent fall.  Denies fever, chest pain, shortness of breath, nausea, vomiting, abdominal pain, urinary symptoms, blood in his stool or urine.   Ankle Pain   Past Medical History:  Diagnosis Date   Anemia    Hypertension    Past Surgical History:  Procedure Laterality Date   EXTERNAL EAR SURGERY     FOOT SURGERY       Home Medications Prior to Admission medications   Medication Sig Start Date End Date Taking? Authorizing Provider  furosemide (LASIX) 20 MG tablet Take 1 tablet (20 mg total) by mouth daily. 03/02/22 04/01/22 Yes Rex Kras, PA  lisinopril (PRINIVIL,ZESTRIL) 10 MG tablet Take 1 tablet (10 mg total) by mouth daily. 05/28/17   Triplett, Tammy, PA-C  naproxen sodium (ALEVE) 220 MG tablet Take 220 mg by mouth daily as needed.    [provider]  polyethylene glycol-electrolytes (TRILYTE) 420 g solution Take 4,000 mLs by mouth as directed. 11/09/17   Mahala Menghini, PA-C      Allergies    Patient has no known allergies.    Review of Systems   Review of Systems Negative except as per HPI.  Physical Exam Updated Vital Signs BP 136/82   Pulse (!) 107   Temp 99.1 F (37.3 C) (Rectal)   Resp 16   SpO2 100%  Physical Exam Vitals and nursing note reviewed.  Constitutional:      Appearance: Normal appearance.  HENT:     Head: Normocephalic and atraumatic.     Mouth/Throat:     Mouth: Mucous membranes are moist.   Eyes:     General: No scleral icterus. Cardiovascular:     Rate and Rhythm: Normal rate and regular rhythm.     Pulses: Normal pulses.     Heart sounds: Normal heart sounds.  Pulmonary:     Effort: Pulmonary effort is normal.     Breath sounds: Normal breath sounds.  Abdominal:     General: Abdomen is flat.     Palpations: Abdomen is soft.     Tenderness: There is no abdominal tenderness.  Musculoskeletal:        General: No deformity.     Comments: Bilateral non-pitting edema of legs with tenderness to palpation. No skin erythema noted.  Skin:    General: Skin is warm.     Findings: No rash.  Neurological:     General: No focal deficit present.     Mental Status: He is alert.  Psychiatric:        Mood and Affect: Mood normal.     ED Results / Procedures / Treatments   Labs (all labs ordered are listed, but only abnormal results are displayed) Labs Reviewed  CBC - Abnormal; Notable for the following components:      Result Value   RBC 2.76 (*)    Hemoglobin 9.1 (*)  HCT 28.5 (*)    MCV 103.3 (*)    All other components within normal limits  COMPREHENSIVE METABOLIC PANEL - Abnormal; Notable for the following components:   Glucose, Bld 107 (*)    Calcium 8.5 (*)    Albumin 2.9 (*)    All other components within normal limits  BRAIN NATRIURETIC PEPTIDE - Abnormal; Notable for the following components:   B Natriuretic Peptide 287.0 (*)    All other components within normal limits  URINALYSIS, ROUTINE W REFLEX MICROSCOPIC  POC OCCULT BLOOD, ED    EKG None  Radiology No results found.  Procedures Procedures    Medications Ordered in ED Medications - No data to display  ED Course/ Medical Decision Making/ A&P                             Medical Decision Making Amount and/or Complexity of Data Reviewed Labs: ordered.  Risk Prescription drug management.   This patient presents to the ED for bilateral leg swelling, this involves an extensive number  of treatment options, and is a complaint that carries with a high risk of complications and morbidity.  The differential diagnosis includes CHF, cirrhosis, AKI, DVT, cellulitis, CHF.  This is not an exhaustive list.  Lab tests: I ordered and personally interpreted labs.  The pertinent results include: WBC unremarkable. Hbg 9.1. Platelets unremarkable. Electrolytes unremarkable. BUN, creatinine unremarkable.  Fecal occult blood negative.  Problem list/ ED course/ Critical interventions/ Medical management: HPI: See above Vital signs within normal range and stable throughout visit. Laboratory/imaging studies significant for: See above. On physical examination, patient is afebrile and appears in no acute distress.  There was bilateral nonpitting edema of the lower extremity with tenderness to palpation.  Neurovascular intact.  No skin redness or laceration noted.  Low suspicions for cellulitis.  CBC with hemoglobin of 9.1.  Hemoccult negative so low suspicion for GI bleed.  I will send an Rx of Lasix.  Advised patient to follow-up with primary care physician for further evaluation and management, return to the ER if new or worsening symptoms.   I have reviewed the patient home medicines and have made adjustments as needed.  Cardiac monitoring/EKG: The patient was maintained on a cardiac monitor.  I personally reviewed and interpreted the cardiac monitor which showed an underlying rhythm of: sinus rhythm.  Additional history obtained: External records from outside source obtained and reviewed including: Chart review including previous notes, labs, imaging.  Disposition Continued outpatient therapy. Follow-up with PCP recommended for reevaluation of symptoms. Treatment plan discussed with patient.  Pt acknowledged understanding was agreeable to the plan. Worrisome signs and symptoms were discussed with patient, and patient acknowledged understanding to return to the ED if they noticed these signs and  symptoms. Patient was stable upon discharge.   This chart was dictated using voice recognition software.  Despite best efforts to proofread,  errors can occur which can change the documentation meaning.          Final Clinical Impression(s) / ED Diagnoses Final diagnoses:  Varicose veins of leg with swelling, bilateral    Rx / DC Orders ED Discharge Orders          Ordered    furosemide (LASIX) 20 MG tablet  Daily        03/02/22 2049              Rex Kras, [place] 03/02/22 2330    Sabra Heck,  Aaron Edelman, MD 03/02/22 [phone]

## 2022-03-12 ENCOUNTER — Emergency Department (HOSPITAL_COMMUNITY)
Admission: EM | Admit: 2022-03-12 | Discharge: 2022-03-12 | Disposition: A | Discharge status: Home / Self Care | Payer: Medicare Other | Attending: Emergency Medicine | Admitting: Emergency Medicine

## 2022-03-12 ENCOUNTER — Other Ambulatory Visit: Payer: Self-pay

## 2022-03-12 DIAGNOSIS — R600 Localized edema: Secondary | ICD-10-CM | POA: Diagnosis not present

## 2022-03-12 DIAGNOSIS — M7989 Other specified soft tissue disorders: Secondary | ICD-10-CM | POA: Diagnosis present

## 2022-03-12 DIAGNOSIS — E876 Hypokalemia: Secondary | ICD-10-CM | POA: Insufficient documentation

## 2022-03-12 DIAGNOSIS — N3000 Acute cystitis without hematuria: Secondary | ICD-10-CM | POA: Insufficient documentation

## 2022-03-12 LAB — COMPREHENSIVE METABOLIC PANEL
ALT: 10 U/L (ref 0–44)
AST: 18 U/L (ref 15–41)
Albumin: 3.3 g/dL — ABNORMAL LOW (ref 3.5–5.0)
Alkaline Phosphatase: 93 U/L (ref 38–126)
Anion gap: 9 (ref 5–15)
BUN: 12 mg/dL (ref 8–23)
CO2: 26 mmol/L (ref 22–32)
Calcium: 9.2 mg/dL (ref 8.9–10.3)
Chloride: 104 mmol/L (ref 98–111)
Creatinine, Ser: 0.99 mg/dL (ref 0.61–1.24)
GFR, Estimated: >60 mL/min (ref >60)
Glucose, Bld: 93 mg/dL (ref 70–99)
Potassium: 3.3 mmol/L — ABNORMAL LOW (ref 3.5–5.1)
Sodium: 139 mmol/L (ref 135–145)
Total Bilirubin: 0.3 mg/dL (ref 0.3–1.2)
Total Protein: 7.8 g/dL (ref 6.5–8.1)

## 2022-03-12 LAB — URINALYSIS, ROUTINE W REFLEX MICROSCOPIC
Bilirubin Urine: NEGATIVE
Glucose, UA: NEGATIVE mg/dL
Hgb urine dipstick: NEGATIVE
Ketones, ur: NEGATIVE mg/dL
Nitrite: POSITIVE — AB
Protein, ur: NEGATIVE mg/dL
Specific Gravity, Urine: 1.017 (ref 1.005–1.030)
WBC, UA: >50 WBC/hpf (ref 0–5)
pH: 5 (ref 5.0–8.0)

## 2022-03-12 LAB — CBC WITH DIFFERENTIAL/PLATELET
Abs Immature Granulocytes: 0 10*3/uL (ref 0.00–0.07)
Basophils Absolute: 0.1 10*3/uL (ref 0.0–0.1)
Basophils Relative: 1 %
Eosinophils Absolute: 0.4 10*3/uL (ref 0.0–0.5)
Eosinophils Relative: 9 %
HCT: 29.3 % — ABNORMAL LOW (ref 39.0–52.0)
Hemoglobin: 9.2 g/dL — ABNORMAL LOW (ref 13.0–17.0)
Immature Granulocytes: 0 %
Lymphocytes Relative: 33 %
Lymphs Abs: 1.5 10*3/uL (ref 0.7–4.0)
MCH: 31.9 pg (ref 26.0–34.0)
MCHC: 31.4 g/dL (ref 30.0–36.0)
MCV: 101.7 fL — ABNORMAL HIGH (ref 80.0–100.0)
Monocytes Absolute: 0.5 10*3/uL (ref 0.1–1.0)
Monocytes Relative: 12 %
Neutro Abs: 2.1 10*3/uL (ref 1.7–7.7)
Neutrophils Relative %: 45 %
Platelets: 313 10*3/uL (ref 150–400)
RBC: 2.88 MIL/uL — ABNORMAL LOW (ref 4.22–5.81)
RDW: 14.3 % (ref 11.5–15.5)
WBC: 4.6 10*3/uL (ref 4.0–10.5)
nRBC: 0 % (ref 0.0–0.2)

## 2022-03-12 LAB — BRAIN NATRIURETIC PEPTIDE: B Natriuretic Peptide: 148 pg/mL — ABNORMAL HIGH (ref 0.0–100.0)

## 2022-03-12 MED ORDER — CEPHALEXIN 500 MG PO CAPS
1000.0000 mg | ORAL_CAPSULE | Freq: Once | ORAL | Status: AC
  Administered 2022-03-12: 1000 mg via ORAL
  Filled 2022-03-12: qty 2

## 2022-03-12 MED ORDER — POTASSIUM CHLORIDE CRYS ER 20 MEQ PO TBCR
40.0000 meq | EXTENDED_RELEASE_TABLET | Freq: Once | ORAL | Status: AC
  Administered 2022-03-12: 40 meq via ORAL
  Filled 2022-03-12: qty 2

## 2022-03-12 MED ORDER — CEPHALEXIN 500 MG PO CAPS: 1000.0000 mg | ORAL_CAPSULE | Freq: Two times a day (BID) | ORAL | 0 refills | Status: AC

## 2022-03-12 NOTE — ED Provider Notes (Signed)
[facility] Provider Note   CSN: [id number] Arrival date & time: 03/12/22  1355     History Chief Complaint  Patient presents with   Leg Swelling    Lawrence Watkins is a 72 y.o. male.  Patient presented to the emergency department complaints of leg swelling.  Patient was seen for similar complaint several days ago and was prescribed Lasix for this.  He reports he has been taking Lasix as prescribed with some improvement in symptoms and does feel he is urinating more often.  However he is reporting some bladder discomfort and is unsure if this is due to the medication for him to urinate more often or some other cause.  Denies any obvious urethral discharge, burning with urination, or testicular pain.  HPI     Home Medications Prior to Admission medications   Medication Sig Start Date End Date Taking? Authorizing Provider  cephALEXin (KEFLEX) 500 MG capsule Take 2 capsules (1,000 mg total) by mouth 2 (two) times daily for 7 days. 03/12/22 03/19/22 Yes Luvenia Heller, PA-C  furosemide (LASIX) 20 MG tablet Take 1 tablet (20 mg total) by mouth daily. 03/02/22 04/01/22  Rex Kras, PA  lisinopril (PRINIVIL,ZESTRIL) 10 MG tablet Take 1 tablet (10 mg total) by mouth daily. 05/28/17   Triplett, Tammy, PA-C  naproxen sodium (ALEVE) 220 MG tablet Take 220 mg by mouth daily as needed.    [provider]  polyethylene glycol-electrolytes (TRILYTE) 420 g solution Take 4,000 mLs by mouth as directed. 11/09/17   Mahala Menghini, PA-C      Allergies    Patient has no known allergies.    Review of Systems   Review of Systems  Genitourinary:  Positive for frequency.  All other systems reviewed and are negative.   Physical Exam Updated Vital Signs BP (!) 141/87 (BP Location: Right Arm)   Pulse (!) 102   Temp 98.9 F (37.2 C) (Oral)   Resp 16   Ht '6\' 2"'$  (1.88 m)   Wt 71.7 kg   SpO2 100%   BMI 20.29 kg/m  Physical Exam Vitals and nursing note  reviewed.  Constitutional:      Appearance: Normal appearance.  HENT:     Head: Normocephalic and atraumatic.  Eyes:     Pupils: Pupils are equal, round, and reactive to light.  Cardiovascular:     Rate and Rhythm: Normal rate and regular rhythm.     Pulses: Normal pulses.     Heart sounds: Normal heart sounds.  Pulmonary:     Effort: Pulmonary effort is normal.     Breath sounds: Normal breath sounds.  Musculoskeletal:     Right lower leg: 2+ Edema present.     Left lower leg: 2+ Edema present.  Skin:    Capillary Refill: Capillary refill takes less than 2 seconds.  Neurological:     Mental Status: He is alert.     ED Results / Procedures / Treatments   Labs (all labs ordered are listed, but only abnormal results are displayed) Labs Reviewed  COMPREHENSIVE METABOLIC PANEL - Abnormal; Notable for the following components:      Result Value   Potassium 3.3 (*)    Albumin 3.3 (*)    All other components within normal limits  CBC WITH DIFFERENTIAL/PLATELET - Abnormal; Notable for the following components:   RBC 2.88 (*)    Hemoglobin 9.2 (*)    HCT 29.3 (*)    MCV  101.7 (*)    All other components within normal limits  URINALYSIS, ROUTINE W REFLEX MICROSCOPIC - Abnormal; Notable for the following components:   APPearance HAZY (*)    Nitrite POSITIVE (*)    Leukocytes,Ua LARGE (*)    Bacteria, UA MANY (*)    All other components within normal limits  BRAIN NATRIURETIC PEPTIDE - Abnormal; Notable for the following components:   B Natriuretic Peptide 148.0 (*)    All other components within normal limits    EKG None  Radiology No results found.  Procedures Procedures   Medications Ordered in ED Medications  potassium chloride SA (KLOR-CON M) CR tablet 40 mEq (40 mEq Oral Given 03/12/22 1812)  cephALEXin (KEFLEX) capsule 1,000 mg (1,000 mg Oral Given 03/12/22 1814)    ED Course/ Medical Decision Making/ A&P                           Medical Decision  Making Amount and/or Complexity of Data Reviewed Labs: ordered.  Risk Prescription drug management.   This patient presents to the ED for concern of leg swelling.  Differential diagnosis includes cellulitis, congestive heart failure, lower extremity edema   Lab Tests:  I Ordered, and personally interpreted labs.  The pertinent results include: Mild hypokalemia noted with potassium of 3.3, CBC at baseline, BMP improved compared to BMP assessed about a week ago, UA consistent with UTI   Medicines ordered and prescription drug management:  I ordered medication including potassium, Keflex for hypokalemia, UTI I have reviewed the patients home medicines and have made adjustments as needed   Problem List / ED Course:  Patient presented to the emergency department complaints of lower leg swelling that has been persistent since he was started on Lasix about 1 week ago.  He reports has been taking his medication as prescribed without any significant changes or improvement but does report that he is urinating more often.  He also reports some bladder discomfort but is unsure if this is caused by the Lasix causing him to urinate more.  Given presentation, labs were performed to reassess patient's current condition and he is found to have improvement in his BNP level as he has dropped from 300s into the low 100s.  Advised patient continue taking Lasix as prescribed for continued improvement in his swelling also encourage patient to either wear compression stockings or elevate his legs more frequently while he is resting.  Will also treat patient for UTI with Keflex.  Patient agreeable with this treatment plan and verbalized understanding all return precautions.  All questions answered prior to patient discharge.  Final Clinical Impression(s) / ED Diagnoses Final diagnoses:  Leg edema  Acute cystitis without hematuria  Hypokalemia    Rx / DC Orders ED Discharge Orders          Ordered     cephALEXin (KEFLEX) 500 MG capsule  2 times daily        03/12/22 1813              Luvenia Heller, PA-C 03/12/22 Suszanne Conners, MD 03/13/22 1229

## 2022-03-12 NOTE — Congregational Nurse Program (Unsigned)
Patient does not have a place to stay tonight. Congregational Nursing will pay for 4 nights at the [facility] Referred to Lawrence Watkins for housing and PCP assistance

## 2022-03-12 NOTE — ED Triage Notes (Signed)
Pt states he was seen here last week for the same issues.  He has swelling in his knees.  States "the fluid is better but they just keep swelling." Denies injury or SOB.

## 2022-03-12 NOTE — Discharge Instructions (Signed)
You were seen in the emergency department for lower leg swelling. Thankfully your labs were reassuring with some improvement in your BNP level since you started taking Lasix. I would recommend you continue this medication without any adjustments to the dose at this time. I am also treating you for a UTI as your urine appears to show and infection with some symptoms. Please take these antibiotics in their entirety. Follow up with your primary care provider in the next week or so for further evaluation.

## 2022-03-13 NOTE — Congregational Nurse Program (Unsigned)
Dept: [phone]   Congregational Nurse Program Note  Date of Encounter: 03/12/2022  Past Medical History: Past Medical History:  Diagnosis Date   Anemia    Hypertension     Encounter Details:  CNP Questionnaire - 03/13/22 1110       Questionnaire   Ask client: Do you give verbal consent for me to treat you today? Yes    Student Assistance N/A    Location Patient [place]    Visit Setting with [facility]    Patient Status Unhoused    Insurance Medicare    Insurance/Financial Assistance Referral Medicaid;[place] Patient Asst. Fund    Medication Have Medication Insecurities    Medical Provider Yes    Screening Referrals Made N/A    Medical Referrals Made N/A    Medical Appointment Made N/A    Recently w/o PCP, now 1st time PCP visit completed due to CNs referral or appointment made Yes    Food Have Food Insecurities    Transportation Need transportation assistance    Housing/Utilities Referred to housing/utility assistance program;Referred to homeless shelter, day center;No permanent housing    Interpersonal Safety N/A    Interventions [place];Advocate/Support;Spiritual Care    Abnormal to Normal Screening Since Last CN Visit N/A    Screenings CN Performed N/A    Did client attend any of the following based off CNs referral or appointments made? N/A    ED Visit Averted N/A   Patient is in the ED   Life-Saving Intervention Made N/A             Referral received 03/12/22 at 4pm from [place] CN Office manager.  Client reports being Homeless and has been coming and sitting in ER waiting at [facility]. He was seen by ER MD 03/12/22  Referral was requested to assist with housing and PCP. Client with Medicare A and B, no Medicaid. Dr Maudie Mercury at [facility] clinic listed as PCP, unknown when last visit has been. Mariann Laster has provided 4 days stay at [facility] from [employer] and [place]. Stated client would  be into [facility] today 03/13/22.  Today : Reviewed ER visit from 03/12/22 as well as ER visit on 03/02/22  03/12/22 was prescribed antibiotic keflex to treat UTI 03/02/22 was prescribed Lasix 20 mg for lower extremity edema. Will follow up that client has gotten his medications. They were sent to [facility]. [facility] up with [facility] to check if they have been picked up. They have not been picked up yet, will pick up today and gie to [place] for [facility] to deliver. Client was called at [facility] and he has no way to pick up his medications.  PCP: Dr Maudie Mercury is listed in EPIC as PCP, attempted to call office to confirm and they are closed today. Will attempt again on Monday.  Housing: called Reyne Dumas SW with [place] to see if there are housing opportunities for assistance.  He confirmed while on phone that client does not have Food stamps he missed his re -certification. Would need to reapply. Documents needed to apply for housing at [place] card Birth Certificate Social Security Award letter Food stamps award letter No current [employer] program in [place]. We discussed housing for Elderly. Will send email referral to [place] to determine if Reyne Dumas SW would be able to assist with housing applications.  Transportation: client  has no transportation currently. May be eligible for Medicaid. [place] will plan to go assist with application today at [facility] around 2:30 PM. Confirmed by call to client at [place].  Contact: Tommy Medal states he does not have a cell phone will plan to check during Medicaid application visit.  Will continue to assist client to coordinate the above needs  Discussed all needs with [place] to coordinate. [place] [employer]

## 2022-03-16 NOTE — Congregational Nurse Program (Signed)
[place] clinic today for Lawrence Watkins, identified myself and programs I work for and credentials. they state that they have a chart for Lawrence Watkins, but due to a new system they are unable to determine how long it has been since he has been seen.  Informed provider office that Lawrence Watkins would like to continue to see Lawrence Watkins if he can and that he has Medicare A and Medicare B. Staff stated that Lawrence. Watkins would need to call and set up an appointment, that I could not do that for him, but he could be seen. Called Lawrence Watkins at [place], room [address] and made sure talking with correct person by two identifiers. Discussed with client that Lawrence Watkins at [facility] clinic would still see him, but that he would need to call and make his appointment. He states understanding, but states he doesn't need a Lawrence. Right now as he has " too much going on" Discussed that after being seen at the ER recently they want him to be sure to follow up with a PCP. He states understanding, we discussed that it may take some time for an appointment to be open and encouraged him to call today. Provided client with number to [facility] [phone] He states he will call.  Referral to [place] was accepted for SW Reyne Watkins to assist with housing applications. Client has already placed two applications at [facility] apartments and [place]. He stated he has not heard from them.  Discussed that Lawrence. Watkins will be reaching out to him, hopefully before he has to leave [place] on 03/20/22. He states understanding.  He states he has been taking his medications and confirmed he has been able to get food and states he has no other needs at this time.   Will continue to follow as referral for assistance from [place] RN  *Note: [place] from [facility] assisted Lawrence Sins with applying for Medicaid on [phone] and application faxed to DSS on client's behalf.  [place]  [employer]

## 2022-03-20 NOTE — Congregational Nurse Program (Signed)
Called Mr Liscio who is currently housed at [facility] by [employer] and assistance from the [place]. He is in room 104 and was able to connect with motel phone. He was provided a cell phone from hospital ER , but he states "it's not half working"  Integrated health: He states [facility] has visited him twice this week to assist with housing options. He is unsure if he will see him again. *will plan to follow up with Johnny.   Food: he states he has enough to get by because there is no way to cook. Offered to take him food but he declined stating he is unsure of when he has to check out today.  Transportation: asked if he could contact someone to pick him up at Discharge and he states no, he plans to take his time walking. Discussed where [facility] is located and [facility], He knows where the housing authority is located. Discussed our hours and that he can come by at any time for any assistance and discussed we have a food market that he can get some fruit and things he can eat without cooking as he shares even when not a motel he has no where to cook. He is aware of the soup kitchen for lunch. Also he states he has food stamps. We discussed if he has his letter he is eligible for phone since he receives services and we discussed [facility] can help. Unsure if Mr Doree Fudge assisted.  He states once he leaves [facility] there will be no way to really contact him. Encouraged him when in town to stop by our office for any assistance.  PCP: Mr Saslow states he did not call [facility] clinic to make an appointment for care. This RN had attempted to make an appointment for him, but office would not allow me to do so and wanted him to contact them. I encouraged Mr Baerga to call Monday as they are closed today ( He states he knows they are closed) or go by there when in town to make an appointment so that he has a provider to manage his medical care. He states understanding and that he will do  so. Housing: Mr Kuznetsov had already began placing applications to housing for elderly at [facility] and [facility]. Reyne Dumas SW integrated health is also assisting. Most locations have long wait lists and no shelter currently for [place] residents.  Will follow as possible. Difficult when he no longer has a phone or location he is staying at.   [place] [employer]

## 2022-03-24 NOTE — Congregational Nurse Program (Signed)
Contacted today by Reyne Dumas SW for [place]. He some documents for Mr Aronhalt to sign for his Medicaid application as well as subsidized housing information.  He states he has tried to cell phone that was provided to Mr Brickler When Tommy Medal RN and to [facility] program paid for several days at the [facility].  Last this RN spoke to Mr Alyea was the morning of 03/20/22 and that was to be his last day and would be checking out of [facility]. He then stated he had no other way of contacting him once he checked out of the [facility]. Shared this information with Mr. Yow. He states he will ride around [place] to see if he sees him.  See my previous notes in Epic, as I had given Mr Derksen to address and hours Of [facility] office to come by for any needs and to access our food market.  Discussed with Mr Doree Fudge that if I have contact with him I would let him know and also let Mr Doree Fudge know and he will do the same.   [place] [employer]

## 2022-03-31 ENCOUNTER — Telehealth: Payer: Self-pay

## 2022-03-31 NOTE — Telephone Encounter (Signed)
Attempted to contact Mr. Ditommaso with the last phone number her provided at [phone] He stated when last talked with him while at [facility] check out 03/20/22 there was no other number or way to reach him.  Today, No answer, unable to leave voicemail due to voicemail not set up.  Note: integrated health Mr. Reyne Dumas SW has also been attempted to contact Mr. Gruba regarding housing follow up and Medicaid application.   [place] [employer]

## 2022-08-27 ENCOUNTER — Encounter: Payer: Self-pay | Admitting: Family Medicine

## 2022-08-27 ENCOUNTER — Ambulatory Visit (INDEPENDENT_AMBULATORY_CARE_PROVIDER_SITE_OTHER): Payer: 59 | Admitting: Family Medicine

## 2022-08-27 VITALS — BP 134/78 | HR 81 | Ht 74.0 in | Wt 142.1 lb

## 2022-08-27 DIAGNOSIS — R7301 Impaired fasting glucose: Secondary | ICD-10-CM

## 2022-08-27 DIAGNOSIS — E038 Other specified hypothyroidism: Secondary | ICD-10-CM

## 2022-08-27 DIAGNOSIS — Z1159 Encounter for screening for other viral diseases: Secondary | ICD-10-CM

## 2022-08-27 DIAGNOSIS — E7849 Other hyperlipidemia: Secondary | ICD-10-CM | POA: Diagnosis not present

## 2022-08-27 DIAGNOSIS — Z111 Encounter for screening for respiratory tuberculosis: Secondary | ICD-10-CM

## 2022-08-27 DIAGNOSIS — E559 Vitamin D deficiency, unspecified: Secondary | ICD-10-CM

## 2022-08-27 DIAGNOSIS — Z114 Encounter for screening for human immunodeficiency virus [HIV]: Secondary | ICD-10-CM

## 2022-08-27 DIAGNOSIS — Z5900 Homelessness unspecified: Secondary | ICD-10-CM | POA: Diagnosis not present

## 2022-08-27 NOTE — Progress Notes (Signed)
New Patient Office Visit  Subjective:  Patient ID: Lawrence Watkins, male    DOB: 06-Nov-1950  Age: 72 y.o. MRN: [id number]  CC:  Chief Complaint  Patient presents with   New Patient (Initial Visit)    Establish care, homeless, weight loss, requesting FL2 to get in assisted living for food and shelter and healthcare    HPI Lawrence Watkins is a 72 y.o. male with past medical history of anemia, and homelessness presents for establishing care. For the details of today's visit, please refer to the assessment and plan.     Past Medical History:  Diagnosis Date   Anemia    Hypertension     Past Surgical History:  Procedure Laterality Date   EXTERNAL EAR SURGERY     FOOT SURGERY      Family History  Problem Relation Age of Onset   Colon cancer Neg Hx     Social History   Socioeconomic History   Marital status: Divorced    Spouse name: Not on file   Number of children: Not on file   Years of education: Not on file   Highest education level: Not on file  Occupational History   Not on file  Tobacco Use   Smoking status: Never   Smokeless tobacco: Never  Substance and Sexual Activity   Alcohol use: Yes    Alcohol/week: 0.0 standard drinks of alcohol    Comment: occasionally   Drug use: No   Sexual activity: Not on file  Other Topics Concern   Not on file  Social History Narrative   Not on file   Social Determinants of Health   Financial Resource Strain: Not on file  Food Insecurity: Not on file  Transportation Needs: Not on file  Physical Activity: Not on file  Stress: Not on file  Social Connections: Not on file  Intimate Partner Violence: Not on file    ROS Review of Systems  Constitutional:  Negative for fatigue and fever.  Eyes:  Negative for visual disturbance.  Respiratory:  Negative for chest tightness and shortness of breath.   Cardiovascular:  Negative for chest pain and palpitations.  Neurological:  Negative for dizziness and headaches.     Objective:   Today's Vitals: BP 134/78 (BP Location: Right Arm, Patient Position: Sitting, Cuff Size: Normal)   Pulse 81   Ht 6\' 2"  (1.88 m)   Wt 142 lb 1.9 oz (64.5 kg)   SpO2 97%   BMI 18.25 kg/m   Physical Exam HENT:     Head: Normocephalic.     Right Ear: External ear normal.     Left Ear: External ear normal.     Nose: No congestion or rhinorrhea.     Mouth/Throat:     Mouth: Mucous membranes are moist.  Cardiovascular:     Rate and Rhythm: Regular rhythm.     Heart sounds: No murmur heard. Pulmonary:     Effort: No respiratory distress.     Breath sounds: Normal breath sounds.  Neurological:     Mental Status: He is alert.      Assessment & Plan:   Homelessness Assessment & Plan: The patient was seen today in the clinic, accompanied by a representative from the Department of Social Services, who stated that a referral was made by the congregational nurse indicating that the patient is homeless and in need of home placement. The representative mentioned that they have contacted [facility] and requested completion of  the FL-2 form. The form will be completed upon receipt. The patient was advised to fax or email the forms for completion.   IFG (impaired fasting glucose) -     Hemoglobin A1c  Vitamin D deficiency -     VITAMIN D 25 Hydroxy (Vit-D Deficiency, Fractures)  Need for hepatitis C screening test -     Hepatitis C antibody  Encounter for screening for HIV -     HIV Antibody (routine testing w rflx)  Other specified hypothyroidism -     TSH + free T4  Other hyperlipidemia -     Lipid panel -     CMP14+EGFR -     CBC with Differential/Platelet  Screening-pulmonary TB -     QuantiFERON-TB Gold Plus  Note: This chart has been completed using Engineer, civil (consulting) software, and while attempts have been made to ensure accuracy, certain words and phrases may not be transcribed as intended.     Follow-up: Return in about 3  months (around 11/27/2022).   Lawrence Laroche, FNP

## 2022-08-27 NOTE — Patient Instructions (Addendum)
I appreciate the opportunity to provide care to you today!    Follow up:  3 months  Fasting Labs: please stop by the lab during the week  to get your blood drawn (CBC, CMP, TSH, Lipid profile, HgA1c, Vit D)  Screening: TB test, HIV and Hep C   Please fax the FL2 forms to be completed  Please schedule medicare annual wellness   Please stop by your local pharmacy and get your Tdap and Shingles vaccine   Attached with your AVS, you will find valuable resources for self-education. I highly recommend dedicating some time to thoroughly examine them.   Please continue to a heart-healthy diet and increase your physical activities. Try to exercise for at least five days a week.    It was a pleasure to see you and I look forward to continuing to work together on your health and well-being. Please do not hesitate to call the office if you need care or have questions about your care.  In case of emergency, please visit the Emergency Department for urgent care, or contact our clinic at [phone] to schedule an appointment. We're here to help you!   Have a wonderful day and week. With Gratitude, Gilmore Laroche MSN, FNP-BC

## 2022-08-27 NOTE — Assessment & Plan Note (Signed)
The patient was seen today in the clinic, accompanied by a representative from the Department of Social Services, who stated that a referral was made by the congregational nurse indicating that the patient is homeless and in need of home placement. The representative mentioned that they have contacted [facility] and requested completion of the FL-2 form. The form will be completed upon receipt. The patient was advised to fax or email the forms for completion.

## 2022-08-28 LAB — CMP14+EGFR
ALT: 6 IU/L (ref 0–44)
AST: 17 IU/L (ref 0–40)
Albumin: 4.3 g/dL (ref 3.9–4.9)
Alkaline Phosphatase: 130 IU/L — ABNORMAL HIGH (ref 44–121)
BUN/Creatinine Ratio: 14 (ref 10–24)
BUN: 14 mg/dL (ref 8–27)
Bilirubin Total: 0.7 mg/dL (ref 0.0–1.2)
CO2: 21 mmol/L (ref 20–29)
Calcium: 10.2 mg/dL (ref 8.6–10.2)
Chloride: 100 mmol/L (ref 96–106)
Creatinine, Ser: 0.99 mg/dL (ref 0.76–1.27)
Globulin, Total: 3.9 g/dL (ref 1.5–4.5)
Glucose: 90 mg/dL (ref 70–99)
Potassium: 4.1 mmol/L (ref 3.5–5.2)
Sodium: 137 mmol/L (ref 134–144)
Total Protein: 8.2 g/dL (ref 6.0–8.5)
eGFR: 82 mL/min/{1.73_m2} (ref >59)

## 2022-08-28 LAB — HEMOGLOBIN A1C
Est. average glucose Bld gHb Est-mCnc: 128 mg/dL
Hgb A1c MFr Bld: 6.1 % — ABNORMAL HIGH (ref 4.8–5.6)

## 2022-08-28 LAB — HEPATITIS C ANTIBODY: Hep C Virus Ab: NONREACTIVE

## 2022-08-28 LAB — LIPID PANEL
Chol/HDL Ratio: 1.8 ratio (ref 0.0–5.0)
Cholesterol, Total: 148 mg/dL (ref 100–199)
HDL: 83 mg/dL (ref >39)
LDL Chol Calc (NIH): 50 mg/dL (ref 0–99)
Triglycerides: 80 mg/dL (ref 0–149)
VLDL Cholesterol Cal: 15 mg/dL (ref 5–40)

## 2022-08-28 LAB — CBC WITH DIFFERENTIAL/PLATELET
Basophils Absolute: 0.1 10*3/uL (ref 0.0–0.2)
Basos: 1 %
EOS (ABSOLUTE): 0.3 10*3/uL (ref 0.0–0.4)
Eos: 5 %
Hematocrit: 34 % — ABNORMAL LOW (ref 37.5–51.0)
Hemoglobin: 11.1 g/dL — ABNORMAL LOW (ref 13.0–17.7)
Immature Grans (Abs): 0 10*3/uL (ref 0.0–0.1)
Immature Granulocytes: 0 %
Lymphocytes Absolute: 1.8 10*3/uL (ref 0.7–3.1)
Lymphs: 28 %
MCH: 31.2 pg (ref 26.6–33.0)
MCHC: 32.6 g/dL (ref 31.5–35.7)
MCV: 96 fL (ref 79–97)
Monocytes Absolute: 0.7 10*3/uL (ref 0.1–0.9)
Monocytes: 11 %
Neutrophils Absolute: 3.5 10*3/uL (ref 1.4–7.0)
Neutrophils: 55 %
Platelets: 273 10*3/uL (ref 150–450)
RBC: 3.56 x10E6/uL — ABNORMAL LOW (ref 4.14–5.80)
RDW: 14.3 % (ref 11.6–15.4)
WBC: 6.3 10*3/uL (ref 3.4–10.8)

## 2022-08-28 LAB — VITAMIN D 25 HYDROXY (VIT D DEFICIENCY, FRACTURES): Vit D, 25-Hydroxy: 15.2 ng/mL — ABNORMAL LOW (ref 30.0–100.0)

## 2022-08-28 LAB — TSH+FREE T4
Free T4: 1.2 ng/dL (ref 0.82–1.77)
TSH: 1.5 u[IU]/mL (ref 0.450–4.500)

## 2022-08-28 LAB — HIV ANTIBODY (ROUTINE TESTING W REFLEX): HIV Screen 4th Generation wRfx: NONREACTIVE

## 2022-08-30 ENCOUNTER — Other Ambulatory Visit: Payer: Self-pay | Admitting: Family Medicine

## 2022-08-30 DIAGNOSIS — E559 Vitamin D deficiency, unspecified: Secondary | ICD-10-CM

## 2022-08-30 MED ORDER — VITAMIN D (ERGOCALCIFEROL) 1.25 MG (50000 UNIT) PO CAPS: 50000.0000 [IU] | ORAL_CAPSULE | ORAL | 1 refills | Status: DC

## 2022-08-30 NOTE — Progress Notes (Signed)
Please inform the patient that her vitamin D levels are low, and a prescription for a weekly vitamin D supplement has been sent to her pharmacy. Her iron levels are slightly low, and I recommend increasing her intake of iron-rich foods such as red meat, poultry, fish, beans, lentils, spinach, and fortified cereals.

## 2022-08-30 NOTE — Progress Notes (Signed)
Please also inform the patient that his labs indicate he is prediabetic. I recommend reducing his intake of high-sugar foods and beverages and increasing physical activity.

## 2022-08-31 LAB — QUANTIFERON-TB GOLD PLUS
QuantiFERON Mitogen Value: 9.69 [IU]/mL
QuantiFERON Nil Value: 0.1 [IU]/mL
QuantiFERON TB1 Ag Value: 0.08 [IU]/mL
QuantiFERON TB2 Ag Value: 0.07 [IU]/mL
QuantiFERON-TB Gold Plus: NEGATIVE

## 2022-08-31 NOTE — Progress Notes (Signed)
Please inform the patient that his quantiferon TB test was negative

## 2022-10-19 DIAGNOSIS — E559 Vitamin D deficiency, unspecified: Secondary | ICD-10-CM | POA: Diagnosis not present

## 2022-10-19 DIAGNOSIS — Z5900 Homelessness unspecified: Secondary | ICD-10-CM | POA: Diagnosis not present

## 2022-10-19 DIAGNOSIS — D649 Anemia, unspecified: Secondary | ICD-10-CM | POA: Diagnosis not present

## 2022-10-19 DIAGNOSIS — R531 Weakness: Secondary | ICD-10-CM | POA: Diagnosis not present

## 2022-10-23 DIAGNOSIS — E559 Vitamin D deficiency, unspecified: Secondary | ICD-10-CM | POA: Diagnosis not present

## 2022-10-23 DIAGNOSIS — I10 Essential (primary) hypertension: Secondary | ICD-10-CM | POA: Diagnosis not present

## 2022-10-23 DIAGNOSIS — Z131 Encounter for screening for diabetes mellitus: Secondary | ICD-10-CM | POA: Diagnosis not present

## 2022-10-23 DIAGNOSIS — D649 Anemia, unspecified: Secondary | ICD-10-CM | POA: Diagnosis not present

## 2022-10-26 DIAGNOSIS — Z79899 Other long term (current) drug therapy: Secondary | ICD-10-CM | POA: Diagnosis not present

## 2022-10-26 DIAGNOSIS — N390 Urinary tract infection, site not specified: Secondary | ICD-10-CM | POA: Diagnosis not present

## 2022-11-02 DIAGNOSIS — R7303 Prediabetes: Secondary | ICD-10-CM | POA: Diagnosis not present

## 2022-11-16 DIAGNOSIS — I10 Essential (primary) hypertension: Secondary | ICD-10-CM | POA: Diagnosis not present

## 2022-11-16 DIAGNOSIS — D649 Anemia, unspecified: Secondary | ICD-10-CM | POA: Diagnosis not present

## 2022-11-16 DIAGNOSIS — E559 Vitamin D deficiency, unspecified: Secondary | ICD-10-CM | POA: Diagnosis not present

## 2022-11-16 DIAGNOSIS — R531 Weakness: Secondary | ICD-10-CM | POA: Diagnosis not present

## 2022-11-27 ENCOUNTER — Ambulatory Visit: Payer: 59 | Admitting: Family Medicine

## 2023-01-28 DIAGNOSIS — E559 Vitamin D deficiency, unspecified: Secondary | ICD-10-CM | POA: Diagnosis not present

## 2023-01-28 DIAGNOSIS — I10 Essential (primary) hypertension: Secondary | ICD-10-CM | POA: Diagnosis not present

## 2023-01-28 DIAGNOSIS — R531 Weakness: Secondary | ICD-10-CM | POA: Diagnosis not present

## 2023-01-28 DIAGNOSIS — Z79899 Other long term (current) drug therapy: Secondary | ICD-10-CM | POA: Diagnosis not present

## 2023-01-28 DIAGNOSIS — Z5900 Homelessness unspecified: Secondary | ICD-10-CM | POA: Diagnosis not present

## 2023-01-28 DIAGNOSIS — D649 Anemia, unspecified: Secondary | ICD-10-CM | POA: Diagnosis not present

## 2023-02-05 DIAGNOSIS — R7303 Prediabetes: Secondary | ICD-10-CM | POA: Diagnosis not present

## 2023-02-15 DIAGNOSIS — R7303 Prediabetes: Secondary | ICD-10-CM | POA: Diagnosis not present

## 2023-03-04 DIAGNOSIS — H25013 Cortical age-related cataract, bilateral: Secondary | ICD-10-CM | POA: Diagnosis not present

## 2023-03-04 DIAGNOSIS — H2513 Age-related nuclear cataract, bilateral: Secondary | ICD-10-CM | POA: Diagnosis not present

## 2023-03-24 DIAGNOSIS — L84 Corns and callosities: Secondary | ICD-10-CM | POA: Diagnosis not present

## 2023-03-24 DIAGNOSIS — B351 Tinea unguium: Secondary | ICD-10-CM | POA: Diagnosis not present

## 2023-03-24 DIAGNOSIS — M19071 Primary osteoarthritis, right ankle and foot: Secondary | ICD-10-CM | POA: Diagnosis not present

## 2023-03-24 DIAGNOSIS — L603 Nail dystrophy: Secondary | ICD-10-CM | POA: Diagnosis not present

## 2023-03-24 DIAGNOSIS — R2681 Unsteadiness on feet: Secondary | ICD-10-CM | POA: Diagnosis not present

## 2023-03-24 DIAGNOSIS — M79675 Pain in left toe(s): Secondary | ICD-10-CM | POA: Diagnosis not present

## 2023-03-24 DIAGNOSIS — M79674 Pain in right toe(s): Secondary | ICD-10-CM | POA: Diagnosis not present

## 2023-06-02 DIAGNOSIS — L608 Other nail disorders: Secondary | ICD-10-CM | POA: Diagnosis not present

## 2023-06-02 DIAGNOSIS — M79674 Pain in right toe(s): Secondary | ICD-10-CM | POA: Diagnosis not present

## 2023-06-02 DIAGNOSIS — B351 Tinea unguium: Secondary | ICD-10-CM | POA: Diagnosis not present

## 2023-06-02 DIAGNOSIS — M19072 Primary osteoarthritis, left ankle and foot: Secondary | ICD-10-CM | POA: Diagnosis not present

## 2023-06-02 DIAGNOSIS — R2689 Other abnormalities of gait and mobility: Secondary | ICD-10-CM | POA: Diagnosis not present

## 2023-06-02 DIAGNOSIS — L851 Acquired keratosis [keratoderma] palmaris et plantaris: Secondary | ICD-10-CM | POA: Diagnosis not present

## 2023-06-02 DIAGNOSIS — M79675 Pain in left toe(s): Secondary | ICD-10-CM | POA: Diagnosis not present

## 2023-06-04 DIAGNOSIS — R7303 Prediabetes: Secondary | ICD-10-CM | POA: Diagnosis not present

## 2023-06-04 DIAGNOSIS — D649 Anemia, unspecified: Secondary | ICD-10-CM | POA: Diagnosis not present

## 2023-06-04 DIAGNOSIS — I10 Essential (primary) hypertension: Secondary | ICD-10-CM | POA: Diagnosis not present

## 2023-06-04 DIAGNOSIS — E559 Vitamin D deficiency, unspecified: Secondary | ICD-10-CM | POA: Diagnosis not present

## 2023-06-10 DIAGNOSIS — D649 Anemia, unspecified: Secondary | ICD-10-CM | POA: Diagnosis not present

## 2023-07-02 DIAGNOSIS — D649 Anemia, unspecified: Secondary | ICD-10-CM | POA: Diagnosis not present

## 2023-08-03 DIAGNOSIS — M19071 Primary osteoarthritis, right ankle and foot: Secondary | ICD-10-CM | POA: Diagnosis not present

## 2023-08-03 DIAGNOSIS — L603 Nail dystrophy: Secondary | ICD-10-CM | POA: Diagnosis not present

## 2023-08-03 DIAGNOSIS — M79675 Pain in left toe(s): Secondary | ICD-10-CM | POA: Diagnosis not present

## 2023-08-03 DIAGNOSIS — R2689 Other abnormalities of gait and mobility: Secondary | ICD-10-CM | POA: Diagnosis not present

## 2023-08-03 DIAGNOSIS — L84 Corns and callosities: Secondary | ICD-10-CM | POA: Diagnosis not present

## 2023-08-03 DIAGNOSIS — B351 Tinea unguium: Secondary | ICD-10-CM | POA: Diagnosis not present

## 2023-08-03 DIAGNOSIS — M79674 Pain in right toe(s): Secondary | ICD-10-CM | POA: Diagnosis not present

## 2023-10-28 ENCOUNTER — Encounter: Payer: Self-pay | Admitting: Family Medicine

## 2023-10-28 ENCOUNTER — Ambulatory Visit: Admitting: Family Medicine

## 2023-10-28 VITALS — BP 175/107 | HR 99 | Ht 74.0 in | Wt 154.0 lb

## 2023-10-28 DIAGNOSIS — E559 Vitamin D deficiency, unspecified: Secondary | ICD-10-CM | POA: Diagnosis not present

## 2023-10-28 DIAGNOSIS — E038 Other specified hypothyroidism: Secondary | ICD-10-CM

## 2023-10-28 DIAGNOSIS — Z23 Encounter for immunization: Secondary | ICD-10-CM | POA: Diagnosis not present

## 2023-10-28 DIAGNOSIS — D649 Anemia, unspecified: Secondary | ICD-10-CM | POA: Diagnosis not present

## 2023-10-28 DIAGNOSIS — E782 Mixed hyperlipidemia: Secondary | ICD-10-CM

## 2023-10-28 DIAGNOSIS — R030 Elevated blood-pressure reading, without diagnosis of hypertension: Secondary | ICD-10-CM

## 2023-10-28 DIAGNOSIS — R7301 Impaired fasting glucose: Secondary | ICD-10-CM | POA: Diagnosis not present

## 2023-10-28 DIAGNOSIS — I10 Essential (primary) hypertension: Secondary | ICD-10-CM | POA: Insufficient documentation

## 2023-10-28 NOTE — Assessment & Plan Note (Signed)
 Patient educated on CDC recommendation for the vaccine. Verbal consent was obtained from the patient, vaccine administered by nurse, no sign of adverse reactions noted at this time. Patient education on arm soreness and use of tylenol or ibuprofen for this patient  was discussed. Patient educated on the signs and symptoms of adverse effect and advise to contact the office if they occur.

## 2023-10-28 NOTE — Progress Notes (Signed)
 Established Patient Office Visit  Subjective:  Patient ID: Lawrence Watkins, male    DOB: 07/04/1950  Age: 73 y.o. MRN: [id number]  CC:  Chief Complaint  Patient presents with   adult services    Discuss adult care services    HPI Lawrence Watkins is a 73 y.o. male  with a past medical history of anemia, presents for follow-up of chronic medical conditions. He reports that his caregiver encouraged him to request accommodations; however, the patient states that he is independent and denies any current limitations at this time.   Past Medical History:  Diagnosis Date   Anemia    Hypertension     Past Surgical History:  Procedure Laterality Date   EXTERNAL EAR SURGERY     FOOT SURGERY      Family History  Problem Relation Age of Onset   Colon cancer Neg Hx     Social History   Socioeconomic History   Marital status: Divorced    Spouse name: Not on file   Number of children: Not on file   Years of education: Not on file   Highest education level: Not on file  Occupational History   Not on file  Tobacco Use   Smoking status: Never   Smokeless tobacco: Never  Substance and Sexual Activity   Alcohol use: Yes    Alcohol/week: 0.0 standard drinks of alcohol    Comment: occasionally   Drug use: No   Sexual activity: Not on file  Other Topics Concern   Not on file  Social History Narrative   Not on file   Social Drivers of Health   Financial Resource Strain: Not on file  Food Insecurity: Not on file  Transportation Needs: Not on file  Physical Activity: Not on file  Stress: Not on file  Social Connections: Not on file  Intimate Partner Violence: Not on file    Outpatient Medications Prior to Visit  Medication Sig Dispense Refill   furosemide  (LASIX ) 20 MG tablet Take 1 tablet (20 mg total) by mouth daily. (Patient not taking: Reported on 08/27/2022) 30 tablet 0   lisinopril  (PRINIVIL ,ZESTRIL ) 10 MG tablet Take 1 tablet (10 mg total) by mouth daily.  (Patient not taking: Reported on 08/27/2022) 30 tablet 0   naproxen sodium (ALEVE) 220 MG tablet Take 220 mg by mouth daily as needed. (Patient not taking: Reported on 08/27/2022)     polyethylene glycol-electrolytes (TRILYTE) 420 g solution Take 4,000 mLs by mouth as directed. (Patient not taking: Reported on 08/27/2022) 4000 mL 0   Vitamin D , Ergocalciferol , (DRISDOL ) 1.25 MG (50000 UNIT) CAPS capsule Take 1 capsule (50,000 Units total) by mouth every 7 (seven) days. 20 capsule 1   No facility-administered medications prior to visit.    No Known Allergies  ROS Review of Systems  Constitutional:  Negative for fatigue and fever.  Eyes:  Negative for visual disturbance.  Respiratory:  Negative for chest tightness and shortness of breath.   Cardiovascular:  Negative for chest pain and palpitations.  Neurological:  Negative for dizziness and headaches.      Objective:    Physical Exam HENT:     Head: Normocephalic.     Right Ear: External ear normal.     Left Ear: External ear normal.     Nose: No congestion or rhinorrhea.     Mouth/Throat:     Mouth: Mucous membranes are moist.  Cardiovascular:     Rate and Rhythm: Regular rhythm.  Heart sounds: No murmur heard. Pulmonary:     Effort: No respiratory distress.     Breath sounds: Normal breath sounds.  Neurological:     Mental Status: He is alert.     BP (!) 175/107   Pulse 99   Ht 6' 2 (1.88 m)   Wt 154 lb (69.9 kg)   SpO2 97%   BMI 19.77 kg/m  Wt Readings from Last 3 Encounters:  10/28/23 154 lb (69.9 kg)  08/27/22 142 lb 1.9 oz (64.5 kg)  03/12/22 158 lb (71.7 kg)    Lab Results  Component Value Date   TSH 1.500 08/27/2022   Lab Results  Component Value Date   WBC 6.3 08/27/2022   HGB 11.1 (L) 08/27/2022   HCT 34.0 (L) 08/27/2022   MCV 96 08/27/2022   PLT 273 08/27/2022   Lab Results  Component Value Date   NA 137 08/27/2022   K 4.1 08/27/2022   CO2 21 08/27/2022   GLUCOSE 90 08/27/2022   BUN 14  08/27/2022   CREATININE 0.99 08/27/2022   BILITOT 0.7 08/27/2022   ALKPHOS 130 (H) 08/27/2022   AST 17 08/27/2022   ALT 6 08/27/2022   PROT 8.2 08/27/2022   ALBUMIN 4.3 08/27/2022   CALCIUM 10.2 08/27/2022   ANIONGAP 9 03/12/2022   EGFR 82 08/27/2022   Lab Results  Component Value Date   CHOL 148 08/27/2022   Lab Results  Component Value Date   HDL 83 08/27/2022   Lab Results  Component Value Date   LDLCALC 50 08/27/2022   Lab Results  Component Value Date   TRIG 80 08/27/2022   Lab Results  Component Value Date   CHOLHDL 1.8 08/27/2022   Lab Results  Component Value Date   HGBA1C 6.1 (H) 08/27/2022      Assessment & Plan:  Anemia, unspecified type Assessment & Plan: Asymptomatic in the clinic pending labs   Encounter for immunization Assessment & Plan: Patient educated on CDC recommendation for the vaccine. Verbal consent was obtained from the patient, vaccine administered by nurse, no sign of adverse reactions noted at this time. Patient education on arm soreness and use of tylenol or ibuprofen  for this patient  was discussed. Patient educated on the signs and symptoms of adverse effect and advise to contact the office if they occur.   Orders: -     Flu vaccine HIGH DOSE PF(Fluzone Trivalent)  Elevated blood pressure reading in office without diagnosis of hypertension Assessment & Plan: Uncontrolled Blood Pressure The patient's blood pressure is elevated in the clinic today; he is asymptomatic. He reports consuming a high-sodium meal last night. Encouraged adherence to a low-sodium diet and increased physical activity as tolerated. Advised to seek emergency care if experiencing headache, dizziness, blurred vision, chest pain, palpitations, or shortness of breath. Will have the patient follow up in two weeks for a nurse visit to reassess blood pressure. If blood pressure remains elevated, will initiate antihypertensive therapy.    IFG (impaired fasting  glucose) -     Hemoglobin A1c  Vitamin D  deficiency -     VITAMIN D  25 Hydroxy (Vit-D Deficiency, Fractures)  TSH (thyroid -stimulating hormone deficiency) -     TSH + free T4  Mixed hyperlipidemia -     Lipid panel -     CMP14+EGFR -     CBC with Differential/Platelet  Note: This chart has been completed using Engineer, civil (consulting) software, and while attempts have been made to ensure accuracy, certain  words and phrases may not be transcribed as intended.    Follow-up: Return in about 4 months (around 02/28/2024).   Deloy Archey  Z Bacchus, FNP

## 2023-10-28 NOTE — Assessment & Plan Note (Signed)
 Asymptomatic in the clinic pending labs

## 2023-10-28 NOTE — Patient Instructions (Signed)
 I appreciate the opportunity to provide care to you today!    Follow up:  4 months and 2 weeks for nurse visit to assess BP  Fasting Labs: please stop by the lab during the week to get your blood drawn (CBC, CMP, TSH, Lipid profile, HgA1c, Vit D)  For a Healthier YOU, I Recommend: Reducing your intake of sugar, sodium, carbohydrates, and saturated fats. Increasing your fiber intake by incorporating more whole grains, fruits, and vegetables into your meals. Setting healthy goals with a focus on lowering your consumption of carbs, sugar, and unhealthy fats. Adding variety to your diet by including a wide range of fruits and vegetables. Cutting back on soda and limiting processed foods as much as possible. Staying active: In addition to taking your weight loss medication, aim for at least 150 minutes of moderate-intensity physical activity each week for optimal results.    Please follow up if your symptoms worsen or fail to improve.    Please continue to a heart-healthy diet and increase your physical activities. Try to exercise for at least five days a week.    It was a pleasure to see you and I look forward to continuing to work together on your health and well-being. Please do not hesitate to call the office if you need care or have questions about your care.  In case of emergency, please visit the Emergency Department for urgent care, or contact our clinic at [phone] to schedule an appointment. We're here to help you!   Have a wonderful day and week. With Gratitude, Meade JENEANE Gerlach MSN, FNP-BC, PMHNP-BC

## 2023-10-28 NOTE — Assessment & Plan Note (Addendum)
 Uncontrolled Blood Pressure The patient's blood pressure is elevated in the clinic today; he is asymptomatic. He reports consuming a high-sodium meal last night. Encouraged adherence to a low-sodium diet and increased physical activity as tolerated. Advised to seek emergency care if experiencing headache, dizziness, blurred vision, chest pain, palpitations, or shortness of breath. Will have the patient follow up in two weeks for a nurse visit to reassess blood pressure. If blood pressure remains elevated, will initiate antihypertensive therapy.

## 2023-10-29 LAB — CBC WITH DIFFERENTIAL/PLATELET
Basophils Absolute: 0.1 x10E3/uL (ref 0.0–0.2)
Basos: 1 %
EOS (ABSOLUTE): 0.2 x10E3/uL (ref 0.0–0.4)
Eos: 5 %
Hematocrit: 34.4 % — ABNORMAL LOW (ref 37.5–51.0)
Hemoglobin: 11.4 g/dL — ABNORMAL LOW (ref 13.0–17.7)
Immature Grans (Abs): 0 x10E3/uL (ref 0.0–0.1)
Immature Granulocytes: 0 %
Lymphocytes Absolute: 2.1 x10E3/uL (ref 0.7–3.1)
Lymphs: 50 %
MCH: 32.9 pg (ref 26.6–33.0)
MCHC: 33.1 g/dL (ref 31.5–35.7)
MCV: 99 fL — ABNORMAL HIGH (ref 79–97)
Monocytes Absolute: 0.4 x10E3/uL (ref 0.1–0.9)
Monocytes: 9 %
Neutrophils Absolute: 1.5 x10E3/uL (ref 1.4–7.0)
Neutrophils: 35 %
Platelets: 239 x10E3/uL (ref 150–450)
RBC: 3.47 x10E6/uL — ABNORMAL LOW (ref 4.14–5.80)
RDW: 13.1 % (ref 11.6–15.4)
WBC: 4.3 x10E3/uL (ref 3.4–10.8)

## 2023-10-29 LAB — CMP14+EGFR
ALT: 10 IU/L (ref 0–44)
AST: 16 IU/L (ref 0–40)
Albumin: 4.4 g/dL (ref 3.8–4.8)
Alkaline Phosphatase: 77 IU/L (ref 47–123)
BUN/Creatinine Ratio: 11 (ref 10–24)
BUN: 12 mg/dL (ref 8–27)
Bilirubin Total: 0.4 mg/dL (ref 0.0–1.2)
CO2: 22 mmol/L (ref 20–29)
Calcium: 9.9 mg/dL (ref 8.6–10.2)
Chloride: 104 mmol/L (ref 96–106)
Creatinine, Ser: 1.09 mg/dL (ref 0.76–1.27)
Globulin, Total: 3.3 g/dL (ref 1.5–4.5)
Glucose: 93 mg/dL (ref 70–99)
Potassium: 4 mmol/L (ref 3.5–5.2)
Sodium: 141 mmol/L (ref 134–144)
Total Protein: 7.7 g/dL (ref 6.0–8.5)
eGFR: 72 mL/min/1.73 (ref >59)

## 2023-10-29 LAB — TSH+FREE T4
Free T4: 1.2 ng/dL (ref 0.82–1.77)
TSH: 3.09 u[IU]/mL (ref 0.450–4.500)

## 2023-10-29 LAB — LIPID PANEL
Chol/HDL Ratio: 2.3 ratio (ref 0.0–5.0)
Cholesterol, Total: 170 mg/dL (ref 100–199)
HDL: 74 mg/dL (ref >39)
LDL Chol Calc (NIH): 84 mg/dL (ref 0–99)
Triglycerides: 62 mg/dL (ref 0–149)
VLDL Cholesterol Cal: 12 mg/dL (ref 5–40)

## 2023-10-29 LAB — HEMOGLOBIN A1C
Est. average glucose Bld gHb Est-mCnc: 108 mg/dL
Hgb A1c MFr Bld: 5.4 % (ref 4.8–5.6)

## 2023-10-29 LAB — VITAMIN D 25 HYDROXY (VIT D DEFICIENCY, FRACTURES): Vit D, 25-Hydroxy: 26.1 ng/mL — ABNORMAL LOW (ref 30.0–100.0)

## 2023-11-10 ENCOUNTER — Ambulatory Visit

## 2023-11-10 VITALS — BP 171/100

## 2023-11-10 DIAGNOSIS — R030 Elevated blood-pressure reading, without diagnosis of hypertension: Secondary | ICD-10-CM

## 2023-11-10 NOTE — Progress Notes (Signed)
 Patient is in office today for a nurse visit for Blood Pressure Check. Patient blood pressure was 171/100, Patient No chest pain, No shortness of breath. Appt needed in office to address bp.

## 2023-11-18 ENCOUNTER — Ambulatory Visit (INDEPENDENT_AMBULATORY_CARE_PROVIDER_SITE_OTHER): Admitting: Family Medicine

## 2023-11-18 ENCOUNTER — Encounter: Payer: Self-pay | Admitting: Family Medicine

## 2023-11-18 VITALS — BP 142/82 | HR 83 | Ht 74.0 in | Wt 157.0 lb

## 2023-11-18 DIAGNOSIS — I10 Essential (primary) hypertension: Secondary | ICD-10-CM

## 2023-11-18 MED ORDER — LISINOPRIL 10 MG PO TABS: 10.0000 mg | ORAL_TABLET | Freq: Every day | ORAL | 1 refills | Status: AC

## 2023-11-18 NOTE — Assessment & Plan Note (Signed)
 Encouraged the patient to start taking lisinopril  10 mg PO daily as prescribed. Reinforced the importance of a low-sodium diet and increasing physical activity as tolerated to support blood pressure control. The patient will follow up with the nurses in four weeks for a blood pressure recheck and to assess treatment effectiveness. Emergency Precautions: Advise the patient to seek immediate medical attention if blood pressure readings are >=180/120 mmHg or if symptoms develop such as severe headache, chest pain, shortness of breath, palpitations, dizziness, visual changes, or neurological deficits. Patient verbalized understanding.

## 2023-11-18 NOTE — Progress Notes (Signed)
 Established Patient Office Visit  Subjective:  Patient ID: Lawrence Watkins, male    DOB: [birth year]  Age: 73 y.o. MRN: [id number]  CC:  Chief Complaint  Patient presents with   Hypertension    Elevated blood pressure     HPI Lawrence Watkins is a 73 y.o. male with past medical history of Anemia, hypertension presents for f/u of  chronic medical conditions.  For the details of today's visit, please refer to the assessment and plan.     Past Medical History:  Diagnosis Date   Anemia    Hypertension     Past Surgical History:  Procedure Laterality Date   EXTERNAL EAR SURGERY     FOOT SURGERY      Family History  Problem Relation Age of Onset   Colon cancer Neg Hx     Social History   Socioeconomic History   Marital status: Divorced    Spouse name: Not on file   Number of children: Not on file   Years of education: Not on file   Highest education level: Not on file  Occupational History   Not on file  Tobacco Use   Smoking status: Never   Smokeless tobacco: Never  Substance and Sexual Activity   Alcohol use: Yes    Alcohol/week: 0.0 standard drinks of alcohol    Comment: occasionally   Drug use: No   Sexual activity: Not on file  Other Topics Concern   Not on file  Social History Narrative   Not on file   Social Drivers of Health   Financial Resource Strain: Not on file  Food Insecurity: Not on file  Transportation Needs: Not on file  Physical Activity: Not on file  Stress: Not on file  Social Connections: Not on file  Intimate Partner Violence: Not on file    Outpatient Medications Prior to Visit  Medication Sig Dispense Refill   furosemide  (LASIX ) 20 MG tablet Take 1 tablet (20 mg total) by mouth daily. (Patient not taking: Reported on 08/27/2022) 30 tablet 0   naproxen sodium (ALEVE) 220 MG tablet Take 220 mg by mouth daily as needed. (Patient not taking: Reported on 08/27/2022)     polyethylene glycol-electrolytes (TRILYTE) 420 g solution Take  4,000 mLs by mouth as directed. (Patient not taking: Reported on 08/27/2022) 4000 mL 0   Vitamin D , Ergocalciferol , (DRISDOL ) 1.25 MG (50000 UNIT) CAPS capsule Take 1 capsule (50,000 Units total) by mouth every 7 (seven) days. 20 capsule 1   lisinopril  (PRINIVIL ,ZESTRIL ) 10 MG tablet Take 1 tablet (10 mg total) by mouth daily. (Patient not taking: Reported on 08/27/2022) 30 tablet 0   No facility-administered medications prior to visit.    No Known Allergies  ROS Review of Systems  Constitutional:  Negative for fatigue and fever.  Eyes:  Negative for visual disturbance.  Respiratory:  Negative for chest tightness and shortness of breath.   Cardiovascular:  Negative for chest pain and palpitations.  Neurological:  Negative for dizziness and headaches.      Objective:    Physical Exam HENT:     Head: Normocephalic.     Right Ear: External ear normal.     Left Ear: External ear normal.     Nose: No congestion or rhinorrhea.     Mouth/Throat:     Mouth: Mucous membranes are moist.  Cardiovascular:     Rate and Rhythm: Regular rhythm.     Heart sounds: No murmur heard. Pulmonary:  Effort: No respiratory distress.     Breath sounds: Normal breath sounds.  Neurological:     Mental Status: He is alert.     BP (!) 142/82   Pulse 83   Ht 6' 2 (1.88 m)   Wt 157 lb (71.2 kg)   SpO2 98%   BMI 20.16 kg/m  Wt Readings from Last 3 Encounters:  11/18/23 157 lb (71.2 kg)  10/28/23 154 lb (69.9 kg)  08/27/22 142 lb 1.9 oz (64.5 kg)    Lab Results  Component Value Date   TSH 3.090 10/28/2023   Lab Results  Component Value Date   WBC 4.3 10/28/2023   HGB 11.4 (L) 10/28/2023   HCT 34.4 (L) 10/28/2023   MCV 99 (H) 10/28/2023   PLT 239 10/28/2023   Lab Results  Component Value Date   NA 141 10/28/2023   K 4.0 10/28/2023   CO2 22 10/28/2023   GLUCOSE 93 10/28/2023   BUN 12 10/28/2023   CREATININE 1.09 10/28/2023   BILITOT 0.4 10/28/2023   ALKPHOS 77 10/28/2023    AST 16 10/28/2023   ALT 10 10/28/2023   PROT 7.7 10/28/2023   ALBUMIN 4.4 10/28/2023   CALCIUM 9.9 10/28/2023   ANIONGAP 9 03/12/2022   EGFR 72 10/28/2023   Lab Results  Component Value Date   CHOL 170 10/28/2023   Lab Results  Component Value Date   HDL 74 10/28/2023   Lab Results  Component Value Date   LDLCALC 84 10/28/2023   Lab Results  Component Value Date   TRIG 62 10/28/2023   Lab Results  Component Value Date   CHOLHDL 2.3 10/28/2023   Lab Results  Component Value Date   HGBA1C 5.4 10/28/2023      Assessment & Plan:  Primary hypertension Assessment & Plan: Encouraged the patient to start taking lisinopril  10 mg PO daily as prescribed. Reinforced the importance of a low-sodium diet and increasing physical activity as tolerated to support blood pressure control. The patient will follow up with the nurses in four weeks for a blood pressure recheck and to assess treatment effectiveness. Emergency Precautions: Advise the patient to seek immediate medical attention if blood pressure readings are >=180/120 mmHg or if symptoms develop such as severe headache, chest pain, shortness of breath, palpitations, dizziness, visual changes, or neurological deficits. Patient verbalized understanding.    Orders: -     Lisinopril ; Take 1 tablet (10 mg total) by mouth daily.  Dispense: 30 tablet; Refill: 1  Note: This chart has been completed using [employer] (consulting) software, and while attempts have been made to ensure accuracy, certain words and phrases may not be transcribed as intended.    Follow-up: Return in about 4 months (around 03/17/2024).   Lawrence Watkins  Lawrence Bacchus, FNP

## 2023-11-18 NOTE — Patient Instructions (Addendum)
 I appreciate the opportunity to provide care to you today!    Follow up:  4 months/  1 month f/u with nurses for BP recheck   Hypertension Management  Your current blood pressure is above the target goal of <140/90 mmHg. To address this, please start taking Lisinopril  10 mg daily.  Medication Instructions: Take your blood pressure medication at the same time each day. After taking your medication, check your blood pressure at least an hour later. If your first reading is >140/90 mmHg, wait at least 10 minutes and recheck your blood pressure. Side Effects: In the initial days of therapy, you may experience dizziness or lightheadedness as your body adjusts to the lower blood pressure; this is expected. Diet and Lifestyle: Adhere to a low-sodium diet, limiting intake to less than 1500 mg daily, and increase your physical activity. Avoid over-the-counter NSAIDs such as ibuprofen  and naproxen while on this medication. Hydration and Nutrition: Stay well-hydrated by drinking at least 64 ounces of water daily. Increase your servings of fruits and vegetables and avoid excessive sodium in your diet. Long-Term Considerations: Uncontrolled hypertension can increase the risk of cardiovascular diseases, including stroke, coronary artery disease, and heart failure.  Please report to the emergency department if your blood pressure exceeds 180/120 and is accompanied by symptoms such as headaches, chest pain, palpitations, blurred vision, or dizziness.    Please follow up if your symptoms worsen or fail to improve.   Attached with your AVS, you will find valuable resources for self-education. I highly recommend dedicating some time to thoroughly examine them.   Please continue to a heart-healthy diet and increase your physical activities. Try to exercise for at least five days a week.    It was a pleasure to see you and I look forward to continuing to work together on your health and  well-being. Please do not hesitate to call the office if you need care or have questions about your care.  In case of emergency, please visit the Emergency Department for urgent care, or contact our clinic at [phone] to schedule an appointment. We're here to help you!   Have a wonderful day and week. With Gratitude, Meade JENEANE Gerlach MSN, FNP-BC, PMHNP-BC

## 2023-11-28 ENCOUNTER — Ambulatory Visit: Payer: Self-pay | Admitting: Family Medicine

## 2023-11-28 DIAGNOSIS — E559 Vitamin D deficiency, unspecified: Secondary | ICD-10-CM

## 2023-11-28 MED ORDER — VITAMIN D (ERGOCALCIFEROL) 1.25 MG (50000 UNIT) PO CAPS: 50000.0000 [IU] | ORAL_CAPSULE | ORAL | 1 refills | Status: AC

## 2023-12-20 ENCOUNTER — Ambulatory Visit

## 2024-03-02 ENCOUNTER — Ambulatory Visit: Admitting: Family Medicine
